# Patient Record
Sex: Female | Born: 1980 | Race: White | Hispanic: No | Marital: Married | Smoking: Current every day smoker
Health system: Southern US, Community
[De-identification: ages and names within clinical notes are randomized; demographics above are authoritative.]

## PROBLEM LIST (undated history)

## (undated) DIAGNOSIS — C801 Malignant (primary) neoplasm, unspecified: Secondary | ICD-10-CM

## (undated) DIAGNOSIS — I10 Essential (primary) hypertension: Secondary | ICD-10-CM

## (undated) DIAGNOSIS — E079 Disorder of thyroid, unspecified: Secondary | ICD-10-CM

## (undated) DIAGNOSIS — E119 Type 2 diabetes mellitus without complications: Secondary | ICD-10-CM

## (undated) HISTORY — PX: TUBAL LIGATION: SHX77

## (undated) HISTORY — PX: CHOLECYSTECTOMY: SHX55

---
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
## 2000-05-07 ENCOUNTER — Emergency Department (HOSPITAL_COMMUNITY): Admission: EM | Admit: 2000-05-07 | Discharge: 2000-05-07 | Payer: Self-pay | Admitting: Emergency Medicine

## 2000-05-10 ENCOUNTER — Emergency Department (HOSPITAL_COMMUNITY): Admission: EM | Admit: 2000-05-10 | Discharge: 2000-05-10 | Payer: Self-pay | Admitting: Emergency Medicine

## 2004-09-04 ENCOUNTER — Emergency Department: Payer: Self-pay | Admitting: Emergency Medicine

## 2004-11-17 ENCOUNTER — Ambulatory Visit (HOSPITAL_COMMUNITY): Admission: RE | Admit: 2004-11-17 | Discharge: 2004-11-17 | Payer: Self-pay | Admitting: Gynecology

## 2004-12-16 ENCOUNTER — Inpatient Hospital Stay (HOSPITAL_COMMUNITY): Admission: AD | Admit: 2004-12-16 | Discharge: 2004-12-16 | Payer: Self-pay | Admitting: Gynecology

## 2005-02-09 ENCOUNTER — Ambulatory Visit: Payer: Self-pay | Admitting: Gynecology

## 2005-02-15 ENCOUNTER — Emergency Department (HOSPITAL_COMMUNITY): Admission: EM | Admit: 2005-02-15 | Discharge: 2005-02-15 | Payer: Self-pay | Admitting: *Deleted

## 2005-02-19 ENCOUNTER — Ambulatory Visit: Payer: Self-pay | Admitting: Gynecology

## 2005-03-18 ENCOUNTER — Inpatient Hospital Stay (HOSPITAL_COMMUNITY): Admission: AD | Admit: 2005-03-18 | Discharge: 2005-03-20 | Payer: Self-pay | Admitting: Gynecology

## 2005-03-18 ENCOUNTER — Encounter (INDEPENDENT_AMBULATORY_CARE_PROVIDER_SITE_OTHER): Payer: Self-pay | Admitting: Specialist

## 2005-03-22 ENCOUNTER — Ambulatory Visit: Payer: Self-pay | Admitting: Gynecology

## 2005-10-01 ENCOUNTER — Ambulatory Visit (HOSPITAL_COMMUNITY): Admission: RE | Admit: 2005-10-01 | Discharge: 2005-10-02 | Payer: Self-pay | Admitting: General Surgery

## 2005-10-01 ENCOUNTER — Encounter (INDEPENDENT_AMBULATORY_CARE_PROVIDER_SITE_OTHER): Payer: Self-pay | Admitting: Specialist

## 2006-06-10 ENCOUNTER — Emergency Department: Payer: Self-pay | Admitting: Emergency Medicine

## 2009-11-02 ENCOUNTER — Emergency Department: Payer: Self-pay | Admitting: Emergency Medicine

## 2010-03-06 ENCOUNTER — Emergency Department (HOSPITAL_COMMUNITY)
Admission: EM | Admit: 2010-03-06 | Discharge: 2010-03-06 | Payer: Self-pay | Source: Home / Self Care | Admitting: Emergency Medicine

## 2010-08-07 NOTE — Discharge Summary (Signed)
Yvonne Olson, Yvonne Olson         ACCOUNT NO.:  [id number]   MEDICAL RECORD NO.:  [id number]          PATIENT TYPE:  INP   LOCATION:  9121                          FACILITY:  [facility]   PHYSICIAN:  Ginger Carne, MD  DATE OF BIRTH:  1980-09-04   DATE OF ADMISSION:  03/18/2005  DATE OF DISCHARGE:  03/20/2005                                 DISCHARGE SUMMARY   REASON FOR HOSPITALIZATION:  Thirty-seven and three-sevenths weeks  gestation, previous repeat cesarean section with possible uterine rupture  and severe lower abdominal pain.   IN-HOSPITAL PROCEDURES:  Repeat low transverse cesarean section and Pomeroy  bilateral tubal ligation.   FINAL DIAGNOSES:  1.  Unruptured uterus.  2.  Preterm viable delivery of female infant.  3.  Gestational diabetes.  4.  Sterilization.   HOSPITAL COURSE:  This patient is a 30 year old gravida 3 para 2-0-0-2  Caucasian female with [facility] April 04, 2005, at 63 and three-sevenths weeks  gestation presenting to the maternity admission unit with severe lower  abdominal pain. The fetal heart rate baseline was 140 without decelerations  and 5-25 beat-per-minute variability. The patient had a previous vaginal  birth after cesarean section in 1998 followed by a rupture, and in addition  in 1997 had a primary cesarean section. At time of the admission both her  husband and said patient were significantly concerned about impending or  possible rupture and based on the said symptoms, despite a normal fetal  heart rate tracing, the patient underwent a repeat low transverse cesarean  section and Pomeroy bilateral tubal ligation. It was difficult to explain to  the couple that more than likely the patient is not undergoing a rupture,  but patient emphasized her concerns. She has also been treated for  gestational diabetes, but non-compliant. Dates were based on last menstrual  period and 20-week ultrasound.   On December28,2006, the patient underwent the  aforementioned procedures. A  female infant weighing 9 pounds 1 ounce (4110 g) with a Apgars of 9 and 9  was delivered in a vertex presentation. No gross abnormalities, and the baby  cried spontaneously at delivery. There was no evidence of uterine rupture.   Postoperative course was uneventful. She was afebrile. Postoperative  hemoglobin 10.4, hematocrit 30.9. Her abdomen was soft. Incision was dry.  Calves without tenderness. The lungs were clear.   The patient was discharged with the routine postoperative and postpartum  instructions including contacting the office for temperature elevation above  100.4 degrees Fahrenheit, increasing abdominal or incisional pain,  incisional  drainage, increased vaginal bleeding, or genitourinary/gastrointestinal  symptoms. The patient was prescribed Percocet 5/325 one to two every 4-6  hours for analgesia and will return in 5 days for staple removal, incision  check, and then in 6 weeks for her regular postoperative/postpartum visit.  All instructions understood by said patient.      Ginger Carne, MD  Electronically Signed     SHB/MEDQ  D:  04/06/2005  T:  04/06/2005  Job:  [id number]

## 2010-08-07 NOTE — Discharge Summary (Signed)
NAMEHILMA, Yvonne Olson         ACCOUNT NO.:  [id number]   MEDICAL RECORD NO.:  [id number]          PATIENT TYPE:  INP   LOCATION:  9121                          [facility]ACILITY:  [facility]   PHYSICIAN:  Ginger Carne, MD  DATE OF BIRTH:  06/16/1980   DATE OF ADMISSION:  03/18/2005  DATE OF DISCHARGE:  03/20/2005                                 DISCHARGE SUMMARY   REASON FOR HOSPITALIZATION:  Thirty-seven and 2/[redacted] weeks gestation with  possible rupture of uterus, history of repeat cesarean section, requests for  sterilization, and gestational diabetes.   IN HOSPITAL PROCEDURES:  Repeat low-transverse cesarean section, Pomeroy  bilateral tubal ligation.   FINAL DIAGNOSES:  1.  Preterm viable delivery of female infant.  2.  Unruptured uterus.  3.  Repeat cesarean section.  4.  Sterilization.   HOSPITAL COURSE:  This is a 30 year old Caucasian female, [facility] April 04, 2005, 37 and 3/[redacted] weeks gestation, who was admitted because of severe lower  pelvic pain.  The patient had had a previous rupture of her uterus following  a failed VBAC.  The fetal heart rate tracing was within normal limits.  However, husband and patient were significantly concerned regarding the  possibility of rupture.  There was no evidence of vaginal bleeding, and the  baseline remained above 110 to 120 beats per minute.  After advising couple  about the possibility of premature issues related to delivery, a repeat low-  transverse cesarean section was performed including an intrapartum bilateral  Pomeroy tubal ligation.  No evidence for rupture was noted.  The patient's  postoperative course was uneventful.  She was afebrile.  Incisions dry.  Her  postoperative hemoglobin was 10.4 and hematocrit 30.9.  Incision was dry.  Calves without tenderness.  Lungs were clear.  The patient was discharged  with instructions, including contacting the office for temperature elevation  above 100.4 degrees Fahrenheit, increasing vaginal  bleeding, abdominal pain,  difficulty with urination, constipation, or any other concerns including  incisional drainage.  The patient was prescribed Percocet 5/325 one to two  every four to six hours and will be seen in the office in four to five days  to have her staples removed. Infant weighed 9 lbs. 1 oz. Apgars 9/9.      Ginger Carne, MD  Electronically Signed     SHB/MEDQ  D:  03/20/2005  T:  03/20/2005  Job:  [id number]

## 2010-08-07 NOTE — Op Note (Signed)
NAMEFERMINA, Yvonne Olson         ACCOUNT NO.:  [id number]   MEDICAL RECORD NO.:  [id number]          PATIENT TYPE:  OIB   LOCATION:  1329                         FACILITY:  [facility]   PHYSICIAN:  Leonie Man, M.D.   DATE OF BIRTH:  1980/07/25   DATE OF PROCEDURE:  10/01/2005  DATE OF DISCHARGE:                                 OPERATIVE REPORT   PREOPERATIVE DIAGNOSIS:  Chronic calculous cholecystitis.   POSTOPERATIVE DIAGNOSIS:  Chronic calculous cholecystitis.   PROCEDURE:  Laparoscopic cholecystectomy with interoperative cholangiogram.   SURGEON:  Leonie Man, M.D.   ASSISTANT:  Anselm Pancoast. Zachery Dakins, M.D.   ANESTHESIA:  General.   NOTE:  Ms. Lesko is a 30 year old female presenting with a six week  history of increasing right upper quadrant pain associated with nausea and  vomiting and exacerbated by fatty foods.  She underwent gallbladder  ultrasound which showed multiple stones wedged within the neck of the  gallbladder. Liver function studies are within normal limits.  She comes to  the operating room after the risks and potential benefits of surgery have  been discussed, all questions answered, and consent obtained.   PROCEDURE:  The patient was positioned supinely. Following the induction of  satisfactory general anesthesia, the abdomen is prepped and draped  routinely.  Open laparoscopy was created at the umbilicus with the insertion  of a Hassan cannula. Insufflation of the peritoneal cavity to 14 mmHg  pressure using carbon dioxide.  Camera inserted. Visual exploration of the  abdomen carried out.  The liver edge is sharp.  The liver surfaces smooth.  Anterior gastric wall and duodenal sweep appeared to be normal.  There were  some small adhesions within the pelvis from previous cesarean section and  tubal ligation.  The gallbladder was chronically scarred with multiple  adhesions of the omentum up to the gallbladder.   Under direct vision, epigastric and  flank ports were placed.  The  gallbladder is grasped and retracted cephalad.  Adhesions to the gallbladder  were taken down carrying the dissection down to the ampulla of the  gallbladder.  Within the ampulla, there was a large wedged stones.  Dissection was carried down to the region of the ampulla so as to isolate  the cystic artery and cystic duct.  The cystic duct was clipped proximally  after it was traced up to the cystic duct gallbladder junction. The cystic  duct was opened and the cystic duct cholangiogram was carried out by passing  a Cook catheter through the abdominal wall and into the cystic duct through  which 1/2 strength Hypaque was injected under fluoroscopic guidance.  The  resulting cholangiogram showed prompt flow of contrast into the duodenum and  the common duct, hepatic duct, and upper radical did not show any evidence  of filling defects.  The cholangiocatheter was removed and the cystic duct  was triply clipped and transected, the cystic artery doubly clamped and  transected.  The gallbladder was then dissected free from the liver bed  using electrocautery, maintaining hemostasis throughout the entire course of  the dissection.  At the end of the dissection, the liver bed  was again  thoroughly inspected for bleeding points and additional bleeding points  treated with electrocautery.  There was no evidence of bile leak.  The right  upper quadrant was then thoroughly irrigated with multiple aliquots of  normal saline.  The gallbladder was placed in an Endopouch and retrieved  through the umbilical wound without difficulty. Sponge, instrument and sharp  counts were verified and the trocars removed under direct vision.  The  wounds were then closed in layers as follows after the pneumoperitoneum was  allowed to deflate.  The umbilical wound in two layers using 0 Vicryl and 4-  0 Monocryl.  The epigastric and flank wounds were closed with 4-0 Monocryl  sutures.  All  wounds were reinforced with Steri-Strips and sterile dressings  applied.  The anesthetic was reversed.  The patient was removed from the  operating room to the recovery room in stable condition.  She tolerated this  procedure well.      Leonie Man, M.D.  Electronically Signed     PB/MEDQ  D:  10/01/2005  T:  10/01/2005  Job:  [id number]   cc:   Windle Guard, M.D.  Fax: [phone]

## 2010-08-07 NOTE — Op Note (Signed)
Yvonne Olson, Yvonne Olson         ACCOUNT NO.:  [id number]   MEDICAL RECORD NO.:  [id number]          PATIENT TYPE:  INP   LOCATION:  9198                          [facility]ACILITY[facility]  [facility]   PHYSICIAN:  Ginger Carne, MD  DATE OF BIRTH:  [birth year]   DATE OF PROCEDURE:  03/18/2005  DATE OF DISCHARGE:                                 OPERATIVE REPORT   PREOPERATIVE DIAGNOSES:  1.  Thirty-seven and two-sevenths weeks' gestation with possible rupture of      uterus.  2.  Repeat cesarean section.  3.  Request for sterilization.  4.  History of gestational diabetes.   POSTOPERATIVE DIAGNOSES:  1.  Preterm viable delivery of a female.  2.  Unruptured uterus.  3.  Repeat cesarean section.  4.  Request for permanent sterilization.  5.  Gestational diabetes.   PROCEDURES:  1.  Repeat low-transverse cesarean section.  2.  Pomeroy bilateral tubal ligation.   SURGEON:  Ginger Carne, M.D.   ASSISTANT:  None.   COMPLICATIONS:  None immediate.   ESTIMATED BLOOD LOSS:  600 mL.   ANESTHESIA:  Spinal.   SPECIMEN:  Portions of right and left tube and cord bloods.   OPERATIVE FINDINGS:  A preterm female delivered with Apgars of 9 and 9,  weight 9 pounds 1 ounce, at 1052 hours on March 18, 2005.  No gross  abnormalities.  The baby cried spontaneously at delivery, vertex  presentation.  Amniotic fluid was clear.  Three-vessel cord, central  insertion.  Placenta complete.  The uterus, tubes and ovaries showed normal  decidual changes of pregnancy.   PROCEDURE:  The patient prepped and draped in the usual fashion and placed  in the left lateral supine position, Betadine solution used for antiseptic,  and the patient was catheterized prior to the procedure.  After adequate  spinal analgesia, a Pfannenstiel incision was made and the abdomen opened.  No evidence of rupture or dehiscence was noted.  The bladder flap incised  transversely, the lower uterine segment incised transversely.   Baby  delivered, cord clamped and cut and infant given to the pediatric staff  after bulb suctioning.  The placenta removed manually, uterus inspected.  Closure of the uterine musculature in one layer using Vicryl running suture,  interlocking.   Pomeroy bilateral tubal ligation performed by grasping either tube at the  isthmus-ampullary junction, 2-3 cm of tube were incorporated into separate 2-  0 plain catgut ties.  Tubes then severed above the knots.  Specimens sent  separately to pathology.  Tips of severed tubes cauterized.  No active  bleeding noted.  Blood clots removed.  Bleeding points hemostatically  checked.  Closure of the parietal peritoneum 2-0 Vicryl running suture, 0  Vicryl running for the fascia from either end to midline, 3-0 Vicryl for the  subcutaneous layer and skin staples for the skin.  Instrument and sponge  count were correct.  The patient tolerated the procedure well and returned  to the post anesthesia recovery room in excellent condition.      Ginger Carne, MD  Electronically Signed     SHB/MEDQ  D:  03/18/2005  T:  03/18/2005  Job:  [id number]

## 2010-09-07 ENCOUNTER — Inpatient Hospital Stay (INDEPENDENT_AMBULATORY_CARE_PROVIDER_SITE_OTHER)
Admission: RE | Admit: 2010-09-07 | Discharge: 2010-09-07 | Disposition: A | Discharge status: Home / Self Care | Payer: Self-pay | Source: Ambulatory Visit | Attending: Emergency Medicine | Admitting: Emergency Medicine

## 2010-09-07 DIAGNOSIS — T6391XA Toxic effect of contact with unspecified venomous animal, accidental (unintentional), initial encounter: Secondary | ICD-10-CM

## 2010-11-27 ENCOUNTER — Emergency Department: Payer: Self-pay | Admitting: *Deleted

## 2011-08-11 ENCOUNTER — Emergency Department: Payer: Self-pay | Admitting: *Deleted

## 2011-08-11 LAB — BASIC METABOLIC PANEL
BUN: 16 mg/dL (ref 7–18)
Calcium, Total: 8.7 mg/dL (ref 8.5–10.1)
Chloride: 107 mmol/L (ref 98–107)
Co2: 25 mmol/L (ref 21–32)
EGFR (African American): >60
Glucose: 166 mg/dL — ABNORMAL HIGH (ref 65–99)
Osmolality: 284 (ref 275–301)
Potassium: 3.7 mmol/L (ref 3.5–5.1)
Sodium: 140 mmol/L (ref 136–145)

## 2011-08-11 LAB — CBC
HCT: 37.6 % (ref 35.0–47.0)
MCH: 30.9 pg (ref 26.0–34.0)
MCHC: 34.7 g/dL (ref 32.0–36.0)
MCV: 89 fL (ref 80–100)
RDW: 13.4 % (ref 11.5–14.5)

## 2011-08-11 LAB — TSH: Thyroid Stimulating Horm: 2.81 u[IU]/mL

## 2012-06-19 DIAGNOSIS — R87619 Unspecified abnormal cytological findings in specimens from cervix uteri: Secondary | ICD-10-CM

## 2012-06-19 DIAGNOSIS — K2970 Gastritis, unspecified, without bleeding: Secondary | ICD-10-CM

## 2012-06-19 DIAGNOSIS — R197 Diarrhea, unspecified: Secondary | ICD-10-CM

## 2012-06-19 DIAGNOSIS — Z720 Tobacco use: Secondary | ICD-10-CM | POA: Insufficient documentation

## 2012-06-19 DIAGNOSIS — E119 Type 2 diabetes mellitus without complications: Secondary | ICD-10-CM

## 2012-06-19 HISTORY — DX: Tobacco use: Z72.0

## 2012-06-19 HISTORY — DX: Unspecified abnormal cytological findings in specimens from cervix uteri: R87.619

## 2012-06-19 HISTORY — DX: Type 2 diabetes mellitus without complications: E11.9

## 2012-06-19 HISTORY — DX: Gastritis, unspecified, without bleeding: K29.70

## 2012-06-19 HISTORY — DX: Diarrhea, unspecified: R19.7

## 2012-09-03 IMAGING — US THYROID ULTRASOUND
1 series · 14 of 23 positions shown · non-contrast
Comparison: none

REASON FOR EXAM: right inferior nuchal mass
COMMENTS:   LMP: Now

PROCEDURE:     US  - US SOFT TISSUE HEAD/NECK  - August 11, 2011 [DATE]
RESULT:

[Series 1: thyroid ultrasound · 0.09mm/px · 14 of 23 slices shown]
[im 1/23]
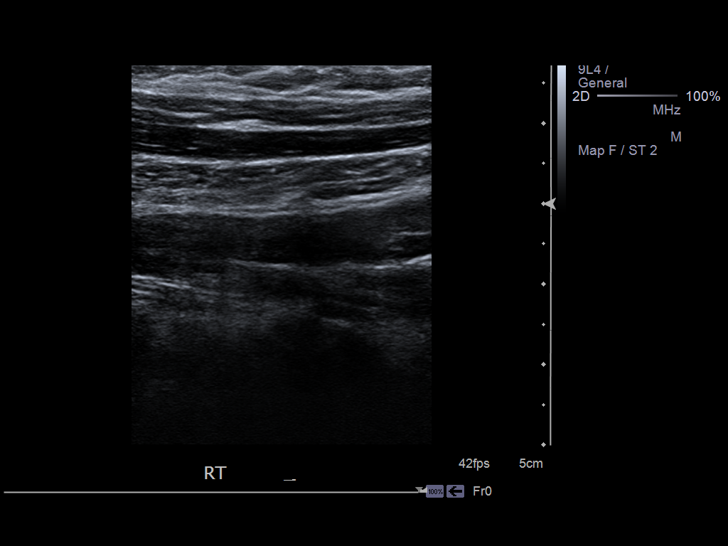
[im 3/23]
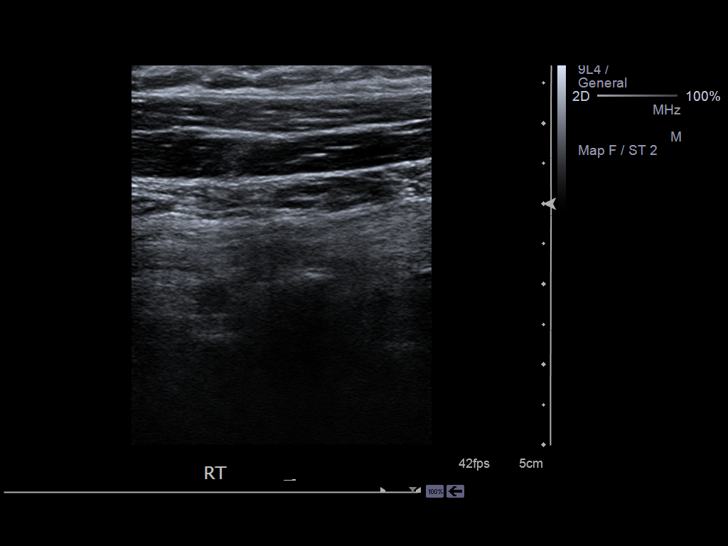
[im 5/23]
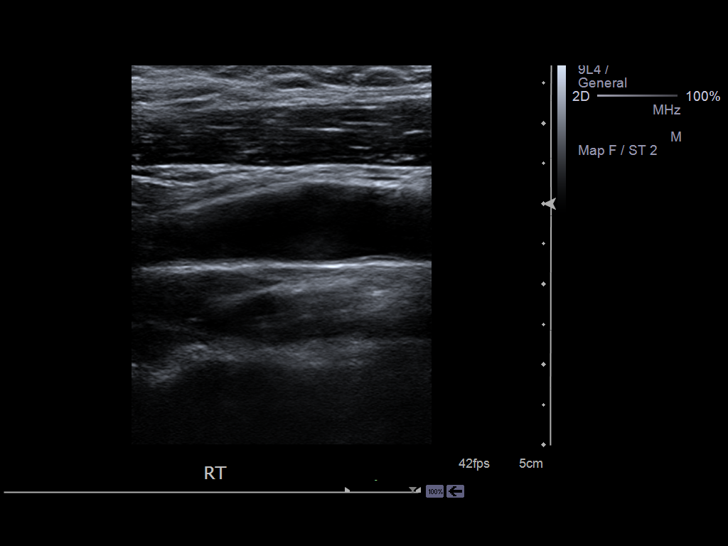
[im 6/23]
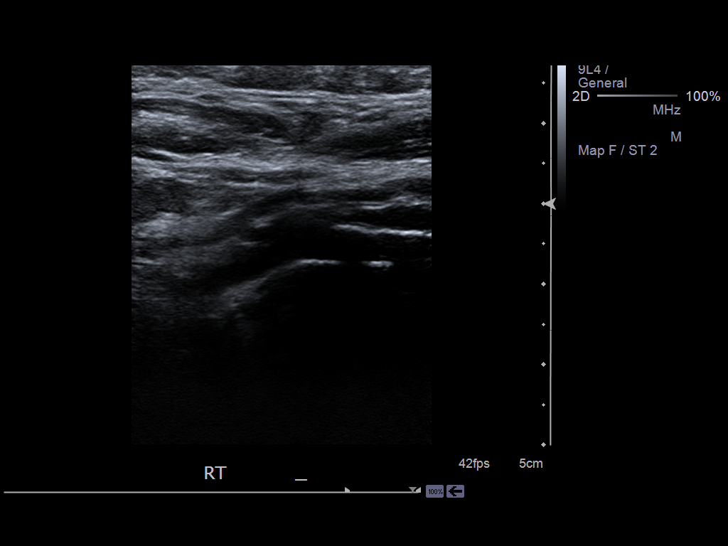
[im 8/23]
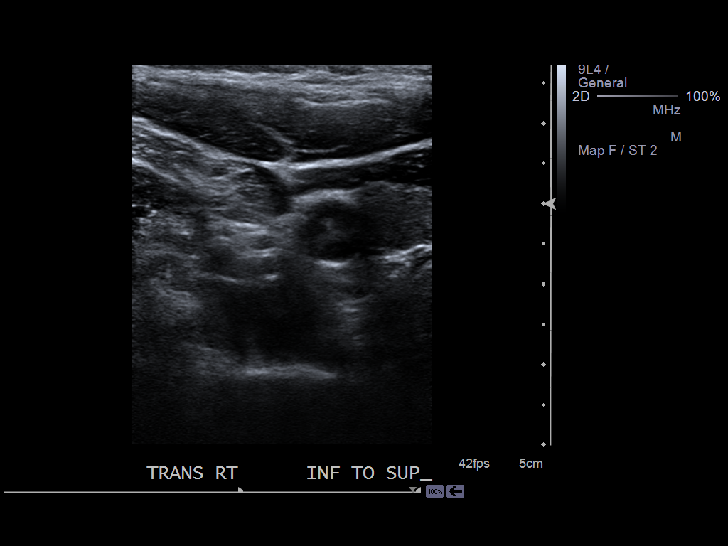
[im 10/23]
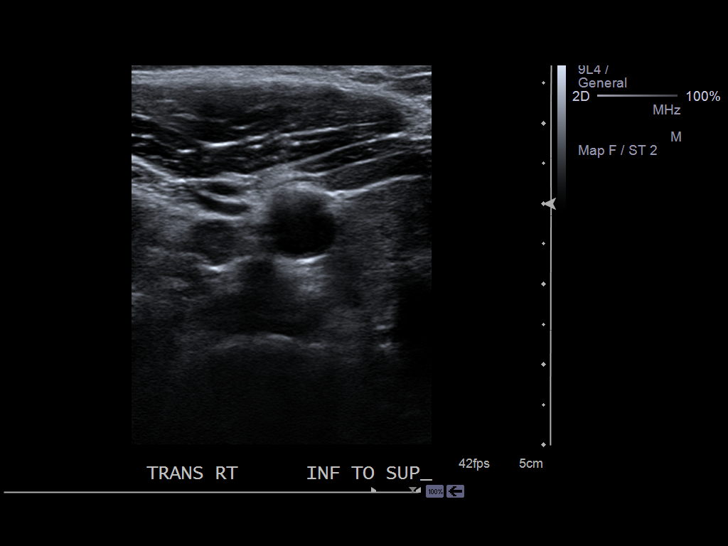
[im 11/23]
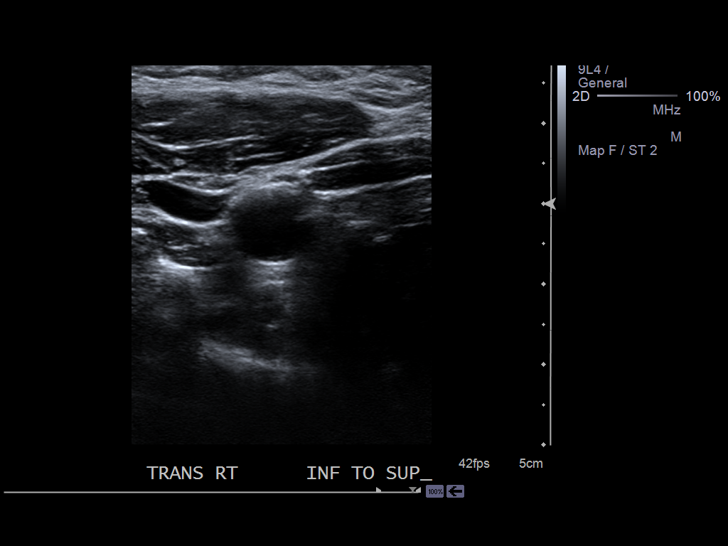
[im 13/23]
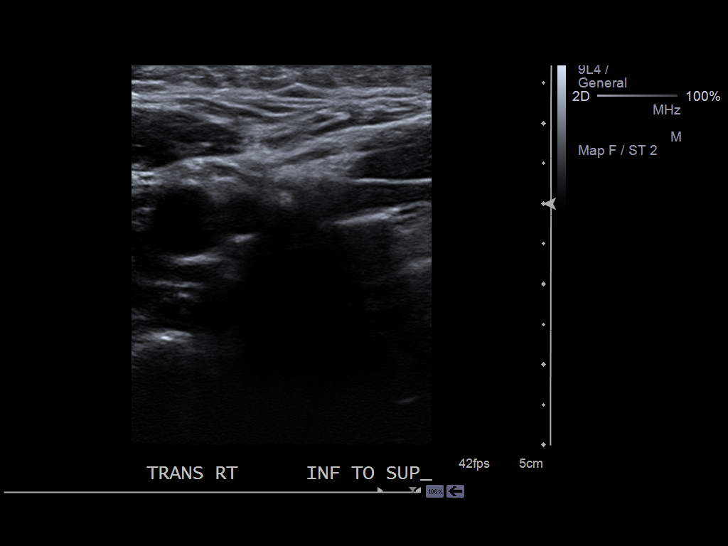
[im 14/23]
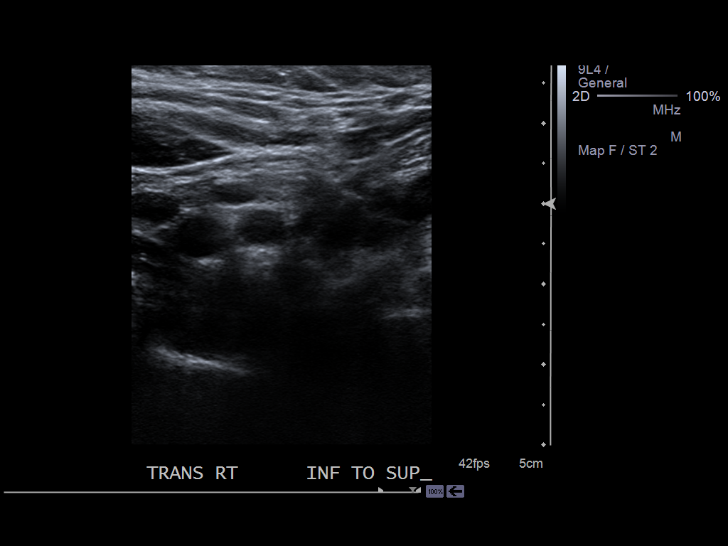
[im 16/23]
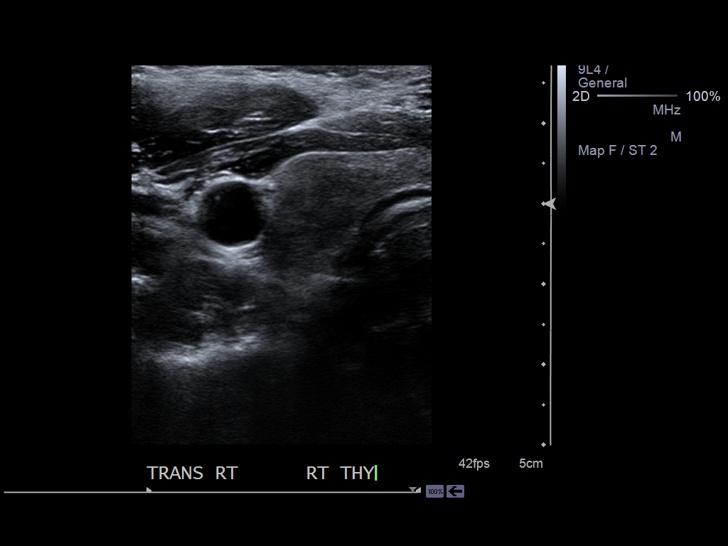
[im 18/23]
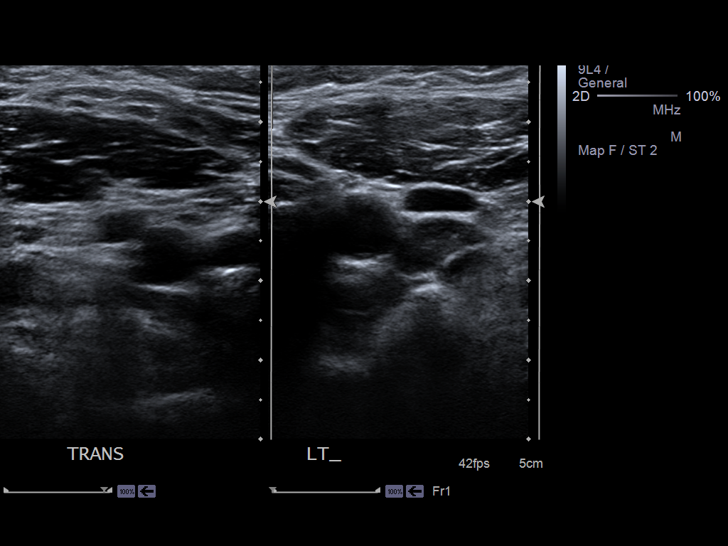
[im 19/23]
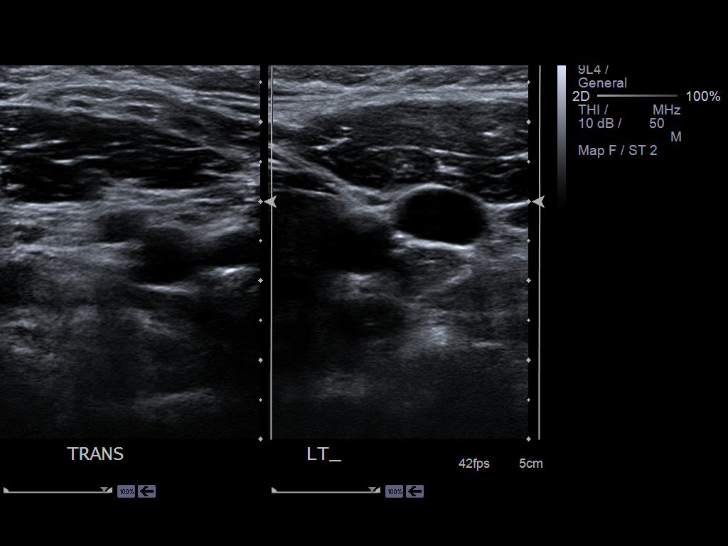
[im 21/23]
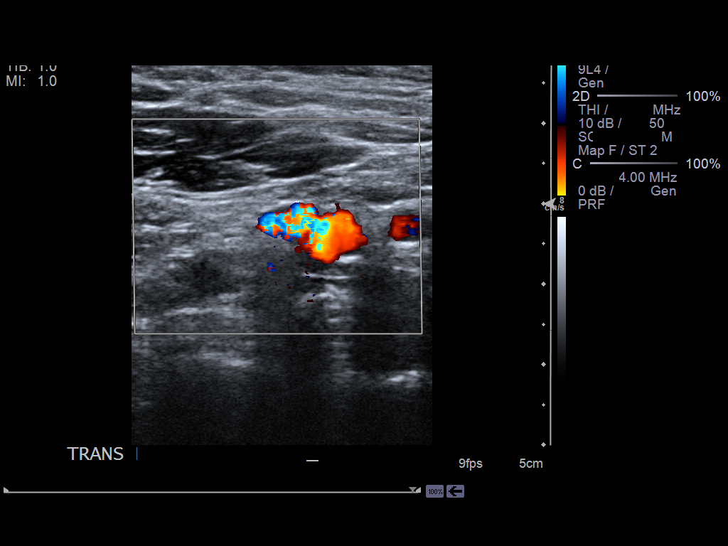
[im 23/23]
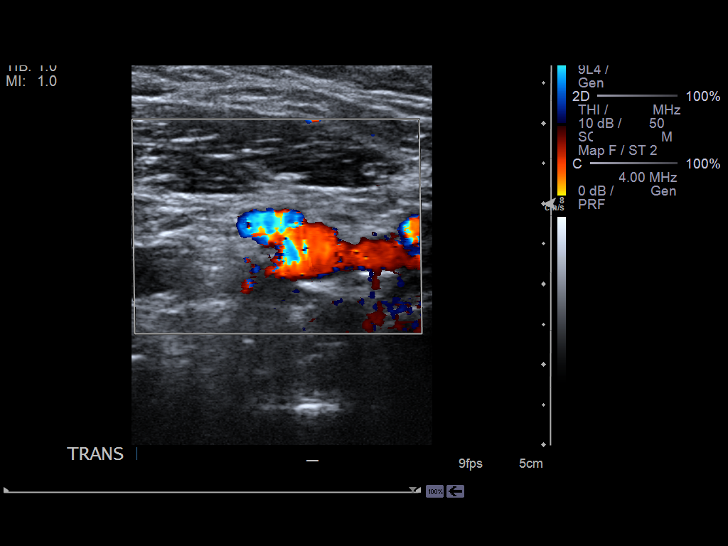

[14 of 23 positions shown; findings below may reference images not displayed]

FINDINGS: Evaluation of the soft tissues of the neck in the region of
interest demonstrates no evidence of solid or cystic masses. The visualized
vasculature is unremarkable. The vessels are patent. Color filling is
identified within the visualized venous and arterial vessels. The visualized
portions of the thyroid gland on the right are unremarkable.
IMPRESSION: 1. Unremarkable sonographic evaluation of the region of interest in the neck
on the right.
2. Charal[place]ia Saparilla, Physician's Assistant of the Emergency Department was
informed of these findings via a preliminary faxed report dated 08/11/2011 at
[DATE] p.m. Eastern Standard Time.

## 2012-12-05 DIAGNOSIS — F419 Anxiety disorder, unspecified: Secondary | ICD-10-CM | POA: Insufficient documentation

## 2012-12-05 HISTORY — DX: Anxiety disorder, unspecified: F41.9

## 2013-03-08 DIAGNOSIS — M26629 Arthralgia of temporomandibular joint, unspecified side: Secondary | ICD-10-CM | POA: Insufficient documentation

## 2013-03-08 DIAGNOSIS — K219 Gastro-esophageal reflux disease without esophagitis: Secondary | ICD-10-CM | POA: Insufficient documentation

## 2013-03-08 HISTORY — DX: Arthralgia of temporomandibular joint, unspecified side: M26.629

## 2013-08-25 ENCOUNTER — Emergency Department: Payer: Self-pay | Admitting: Emergency Medicine

## 2013-08-25 LAB — COMPREHENSIVE METABOLIC PANEL
ALT: 43 U/L (ref 12–78)
Albumin: 4 g/dL (ref 3.4–5.0)
Alkaline Phosphatase: 84 U/L
Anion Gap: 10 (ref 7–16)
BILIRUBIN TOTAL: 0.3 mg/dL (ref 0.2–1.0)
BUN: 11 mg/dL (ref 7–18)
CALCIUM: 8.5 mg/dL (ref 8.5–10.1)
Chloride: 108 mmol/L — ABNORMAL HIGH (ref 98–107)
Co2: 21 mmol/L (ref 21–32)
Creatinine: 0.57 mg/dL — ABNORMAL LOW (ref 0.60–1.30)
EGFR (African American): >60
EGFR (Non-African Amer.): >60
Glucose: 93 mg/dL (ref 65–99)
Osmolality: 277 (ref 275–301)
POTASSIUM: 3.3 mmol/L — AB (ref 3.5–5.1)
SGOT(AST): 26 U/L (ref 15–37)
Sodium: 139 mmol/L (ref 136–145)
TOTAL PROTEIN: 7.1 g/dL (ref 6.4–8.2)

## 2013-08-25 LAB — DRUG SCREEN, URINE

## 2013-08-25 LAB — URINALYSIS, COMPLETE
Bilirubin,UR: NEGATIVE
Blood: NEGATIVE
Glucose,UR: NEGATIVE mg/dL (ref 0–75)
Ketone: NEGATIVE
Leukocyte Esterase: NEGATIVE
Nitrite: NEGATIVE
Ph: 6 (ref 4.5–8.0)
Protein: NEGATIVE
RBC,UR: <1 /HPF (ref 0–5)
Specific Gravity: 1.005 (ref 1.003–1.030)
Squamous Epithelial: 2
WBC UR: <1 /HPF (ref 0–5)

## 2013-08-25 LAB — TSH: Thyroid Stimulating Horm: 0.918 u[IU]/mL

## 2013-08-25 LAB — CBC WITH DIFFERENTIAL/PLATELET
BASOS ABS: 0 10*3/uL (ref 0.0–0.1)
BASOS PCT: 0.6 %
Eosinophil #: 0 10*3/uL (ref 0.0–0.7)
Eosinophil %: 0.7 %
HCT: 39.8 % (ref 35.0–47.0)
HGB: 13.1 g/dL (ref 12.0–16.0)
LYMPHS PCT: 38.6 %
Lymphocyte #: 2.8 10*3/uL (ref 1.0–3.6)
MCH: 29.6 pg (ref 26.0–34.0)
MCHC: 32.9 g/dL (ref 32.0–36.0)
MCV: 90 fL (ref 80–100)
MONOS PCT: 7.3 %
Monocyte #: 0.5 x10 3/mm (ref 0.2–0.9)
NEUTROS ABS: 3.8 10*3/uL (ref 1.4–6.5)
NEUTROS PCT: 52.8 %
Platelet: 163 10*3/uL (ref 150–440)
RBC: 4.43 10*6/uL (ref 3.80–5.20)
RDW: 14.6 % — ABNORMAL HIGH (ref 11.5–14.5)
WBC: 7.3 10*3/uL (ref 3.6–11.0)

## 2013-08-25 LAB — SALICYLATE LEVEL: Salicylates, Serum: 5.9 mg/dL — ABNORMAL HIGH

## 2013-08-25 LAB — ACETAMINOPHEN LEVEL: Acetaminophen: <2 ug/mL

## 2013-08-25 LAB — ETHANOL
Ethanol %: 0.169 % — ABNORMAL HIGH (ref 0.000–0.080)
Ethanol: 169 mg/dL

## 2013-08-25 LAB — TROPONIN I: Troponin-I: <0.02 ng/mL

## 2013-08-26 LAB — ACETAMINOPHEN LEVEL: Acetaminophen: <2 ug/mL

## 2013-10-20 DIAGNOSIS — E119 Type 2 diabetes mellitus without complications: Secondary | ICD-10-CM | POA: Insufficient documentation

## 2013-10-20 DIAGNOSIS — F17200 Nicotine dependence, unspecified, uncomplicated: Secondary | ICD-10-CM | POA: Insufficient documentation

## 2013-10-20 DIAGNOSIS — Z23 Encounter for immunization: Secondary | ICD-10-CM | POA: Insufficient documentation

## 2013-10-20 DIAGNOSIS — S61209A Unspecified open wound of unspecified finger without damage to nail, initial encounter: Secondary | ICD-10-CM | POA: Insufficient documentation

## 2013-10-20 DIAGNOSIS — Z859 Personal history of malignant neoplasm, unspecified: Secondary | ICD-10-CM | POA: Insufficient documentation

## 2013-10-21 ENCOUNTER — Encounter (HOSPITAL_COMMUNITY): Payer: Self-pay | Admitting: Emergency Medicine

## 2013-10-21 ENCOUNTER — Emergency Department (HOSPITAL_COMMUNITY)
Admission: EM | Admit: 2013-10-21 | Discharge: 2013-10-21 | Disposition: A | Discharge status: Home / Self Care | Payer: Self-pay | Attending: Emergency Medicine | Admitting: Emergency Medicine

## 2013-10-21 DIAGNOSIS — S61211A Laceration without foreign body of left index finger without damage to nail, initial encounter: Secondary | ICD-10-CM

## 2013-10-21 HISTORY — DX: Malignant (primary) neoplasm, unspecified: C80.1

## 2013-10-21 HISTORY — DX: Type 2 diabetes mellitus without complications: E11.9

## 2013-10-21 MED ORDER — TETANUS-DIPHTH-ACELL PERTUSSIS 5-2.5-18.5 LF-MCG/0.5 IM SUSP
0.5000 mL | Freq: Once | INTRAMUSCULAR | Status: AC
  Administered 2013-10-21: 0.5 mL via INTRAMUSCULAR
  Filled 2013-10-21: qty 0.5
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
## 2013-10-21 NOTE — ED Provider Notes (Signed)
Medical screening examination/treatment/procedure(s) were performed by non-physician practitioner and as supervising physician I was immediately available for consultation/collaboration.   EKG Interpretation None       Kalman Drape, MD 10/21/13 [phone]
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
## 2013-10-21 NOTE — Discharge Instructions (Signed)
Keep cut clean. Apply bacitracin ointment twice a day. Ibuprofen or tylenol for pain. Follow up in 7 days for suture removal.   Laceration Care, Adult A laceration is a cut or lesion that goes through all layers of the skin and into the tissue just beneath the skin. TREATMENT  Some lacerations may not require closure. Some lacerations may not be able to be closed due to an increased risk of infection. It is important to see your caregiver as soon as possible after an injury to minimize the risk of infection and maximize the opportunity for successful closure. If closure is appropriate, pain medicines may be given, if needed. The wound will be cleaned to help prevent infection. Your caregiver will use stitches (sutures), staples, wound glue (adhesive), or skin adhesive strips to repair the laceration. These tools bring the skin edges together to allow for faster healing and a better cosmetic outcome. However, all wounds will heal with a scar. Once the wound has healed, scarring can be minimized by covering the wound with sunscreen during the day for 1 full year. HOME CARE INSTRUCTIONS  For sutures or staples:  Keep the wound clean and dry.  If you were given a bandage (dressing), you should change it at least once a day. Also, change the dressing if it becomes wet or dirty, or as directed by your caregiver.  Wash the wound with soap and water 2 times a day. Rinse the wound off with water to remove all soap. Pat the wound dry with a clean towel.  After cleaning, apply a thin layer of the antibiotic ointment as recommended by your caregiver. This will help prevent infection and keep the dressing from sticking.  You may shower as usual after the first 24 hours. Do not soak the wound in water until the sutures are removed.  Only take over-the-counter or prescription medicines for pain, discomfort, or fever as directed by your caregiver.  Get your sutures or staples removed as directed by your  caregiver. For skin adhesive strips:  Keep the wound clean and dry.  Do not get the skin adhesive strips wet. You may bathe carefully, using caution to keep the wound dry.  If the wound gets wet, pat it dry with a clean towel.  Skin adhesive strips will fall off on their own. You may trim the strips as the wound heals. Do not remove skin adhesive strips that are still stuck to the wound. They will fall off in time. For wound adhesive:  You may briefly wet your wound in the shower or bath. Do not soak or scrub the wound. Do not swim. Avoid periods of heavy perspiration until the skin adhesive has fallen off on its own. After showering or bathing, gently pat the wound dry with a clean towel.  Do not apply liquid medicine, cream medicine, or ointment medicine to your wound while the skin adhesive is in place. This may loosen the film before your wound is healed.  If a dressing is placed over the wound, be careful not to apply tape directly over the skin adhesive. This may cause the adhesive to be pulled off before the wound is healed.  Avoid prolonged exposure to sunlight or tanning lamps while the skin adhesive is in place. Exposure to ultraviolet light in the first year will darken the scar.  The skin adhesive will usually remain in place for 5 to 10 days, then naturally fall off the skin. Do not pick at the adhesive film. You  may need a tetanus shot if:  You cannot remember when you had your last tetanus shot.  You have never had a tetanus shot. If you get a tetanus shot, your arm may swell, get red, and feel warm to the touch. This is common and not a problem. If you need a tetanus shot and you choose not to have one, there is a rare chance of getting tetanus. Sickness from tetanus can be serious. SEEK MEDICAL CARE IF:   You have redness, swelling, or increasing pain in the wound.  You see a red line that goes away from the wound.  You have yellowish-white fluid (pus) coming from  the wound.  You have a fever.  You notice a bad smell coming from the wound or dressing.  Your wound breaks open before or after sutures have been removed.  You notice something coming out of the wound such as wood or glass.  Your wound is on your hand or foot and you cannot move a finger or toe. SEEK IMMEDIATE MEDICAL CARE IF:   Your pain is not controlled with prescribed medicine.  You have severe swelling around the wound causing pain and numbness or a change in color in your arm, hand, leg, or foot.  Your wound splits open and starts bleeding.  You have worsening numbness, weakness, or loss of function of any joint around or beyond the wound.  You develop painful lumps near the wound or on the skin anywhere on your body. MAKE SURE YOU:   Understand these instructions.  Will watch your condition.  Will get help right away if you are not doing well or get worse. Document Released: 03/08/2005 Document Revised: 05/31/2011 Document Reviewed: 09/01/2010 [facility] Patient Information 2015 [place], [place]. This information is not intended to replace advice given to you by your health care provider. Make sure you discuss any questions you have with your health care provider.
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
## 2014-12-28 ENCOUNTER — Emergency Department (HOSPITAL_COMMUNITY)
Admission: EM | Admit: 2014-12-28 | Discharge: 2014-12-29 | Disposition: A | Discharge status: Home / Self Care | Payer: Medicaid Other | Attending: Emergency Medicine | Admitting: Emergency Medicine

## 2014-12-28 ENCOUNTER — Encounter (HOSPITAL_COMMUNITY): Payer: Self-pay | Admitting: Emergency Medicine

## 2014-12-28 ENCOUNTER — Emergency Department (HOSPITAL_COMMUNITY): Payer: Medicaid Other

## 2014-12-28 DIAGNOSIS — S81802A Unspecified open wound, left lower leg, initial encounter: Secondary | ICD-10-CM | POA: Insufficient documentation

## 2014-12-28 DIAGNOSIS — Y998 Other external cause status: Secondary | ICD-10-CM | POA: Diagnosis not present

## 2014-12-28 DIAGNOSIS — Z720 Tobacco use: Secondary | ICD-10-CM | POA: Insufficient documentation

## 2014-12-28 DIAGNOSIS — Y9301 Activity, walking, marching and hiking: Secondary | ICD-10-CM | POA: Diagnosis not present

## 2014-12-28 DIAGNOSIS — E119 Type 2 diabetes mellitus without complications: Secondary | ICD-10-CM | POA: Insufficient documentation

## 2014-12-28 DIAGNOSIS — Z859 Personal history of malignant neoplasm, unspecified: Secondary | ICD-10-CM | POA: Insufficient documentation

## 2014-12-28 DIAGNOSIS — Y92000 Kitchen of unspecified non-institutional (private) residence as  the place of occurrence of the external cause: Secondary | ICD-10-CM | POA: Insufficient documentation

## 2014-12-28 DIAGNOSIS — X58XXXA Exposure to other specified factors, initial encounter: Secondary | ICD-10-CM | POA: Diagnosis not present

## 2014-12-28 DIAGNOSIS — S99922A Unspecified injury of left foot, initial encounter: Secondary | ICD-10-CM | POA: Diagnosis present

## 2014-12-28 DIAGNOSIS — S86012A Strain of left Achilles tendon, initial encounter: Secondary | ICD-10-CM

## 2014-12-28 MED ORDER — OXYCODONE-ACETAMINOPHEN 5-325 MG PO TABS
2.0000 | ORAL_TABLET | Freq: Once | ORAL | Status: AC
  Administered 2014-12-28: 2 via ORAL
  Filled 2014-12-28: qty 2

## 2014-12-28 MED ORDER — HYDROCODONE-ACETAMINOPHEN 5-325 MG PO TABS: 1.0000 | ORAL_TABLET | Freq: Four times a day (QID) | ORAL | Status: DC | PRN

## 2014-12-28 NOTE — ED Provider Notes (Signed)
CSN: [id number]     Arrival date & time 12/28/14  2158 History  By signing my name below, I, Evelene Croon, attest that this documentation has been prepared under the direction and in the presence of non-physician practitioner, Abigail Butts, PA-C. Electronically Signed: Evelene Croon, Scribe. 12/28/2014. 11:38 PM.    Chief Complaint  Patient presents with  . Foot Pain   The history is provided by the patient. No language interpreter was used.   HPI Comments:  Yvonne Olson is a 34 y.o. female who presents to the Emergency Department complaining of left foot pain that started after she heard a popping sound this evening ~1 hour PTA. She was walking around her kitchen at the time of symptom onset.Her pain is exacerbated with movement of her toes and ankle. She reports a history of torn achilles tendon ~2-3 years ago to the left foot; notes her pain today is similar. She states she did not require surgery at the time.  No alleviating factors or associated symptoms noted.  Past Medical History  Diagnosis Date  . Diabetes mellitus without complication ([place])   . Cancer [facility])    History reviewed. No pertinent past surgical history. No family history on file. Social History  Substance Use Topics  . Smoking status: Current Every Day Smoker  . Smokeless tobacco: Never Used  . Alcohol Use: Yes   OB History    Gravida Para Term Preterm AB TAB SAB Ectopic Multiple Living   3 3 3       3      Review of Systems  Constitutional: Negative for fever and chills.  Gastrointestinal: Negative for nausea and vomiting.  Musculoskeletal: Positive for myalgias, joint swelling and arthralgias. Negative for back pain, neck pain and neck stiffness.  Skin: Negative for wound.  Neurological: Negative for numbness.  Hematological: Does not bruise/bleed easily.  Psychiatric/Behavioral: The patient is not nervous/anxious.   All other systems reviewed and are negative.  Allergies  Review of  patient's allergies indicates no known allergies.  Home Medications   Prior to Admission medications   Medication Sig Start Date End Date Taking? Authorizing Provider  HYDROcodone-acetaminophen (NORCO/VICODIN) 5-325 MG tablet Take 1 tablet by mouth every 6 (six) hours as needed for moderate pain or severe pain. 12/28/14   Kearra Calkin, PA-C   BP 132/67 mmHg  Pulse 92  Temp(Src) 98.4 F (36.9 C) (Oral)  Resp 20  Ht 5\' 11"  (1.803 m)  Wt 205 lb (92.987 kg)  BMI 28.60 kg/m2  SpO2 100%  LMP 12/18/2014 (Approximate) Physical Exam  Constitutional: She appears well-developed and well-nourished. No distress.  HENT:  Head: Normocephalic and atraumatic.  Eyes: Conjunctivae are normal.  Neck: Normal range of motion.  Cardiovascular: Normal rate, regular rhythm, normal heart sounds and intact distal pulses.   No murmur heard. Capillary refill < 3 sec  Pulmonary/Chest: Effort normal and breath sounds normal.  Musculoskeletal: She exhibits tenderness. She exhibits no edema.  ROM: Full range of motion of the left hip, knee Almost full active range of motion of the left ankle in all toes of left foot Significant tenderness to palpation of the distal Achilles tendon No palpable defect Negative Thompson's test  Neurological: She is alert. Coordination normal.  Sensation intact to dull and sharp in the left lower extremity Strength 4/5 with dorsiflexion plantar flexion due to pain  Skin: Skin is warm and dry. She is not diaphoretic.  No tenting of the skin  Psychiatric: She has a normal mood  and affect.  Nursing note and vitals reviewed.   ED Course  Procedures   DIAGNOSTIC STUDIES:  Oxygen Saturation is 100% on RA, normal by my interpretation.    COORDINATION OF CARE:  10:57 PM Discussed treatment plan with pt at bedside and pt agreed to plan.  11:38 PM EMERGENCY DEPARTMENT US SOFT TISSUE INTERPRETATION "Study: Limited Ultrasound of the noted body part in comments  below"  INDICATIONS: Pain Multiple views of the body part are obtained with a multi-frequency linear probe  PERFORMED BY:  Myself  IMAGES ARCHIVED?: Yes  SIDE:Left  BODY PART: Left achilles tendon  FINDINGS: Other decreased echogenicity in the distal achilles  LIMITATIONS:  Body Habitus  INTERPRETATION:  Decreased echogenicity in the achilles likely representing tears in the achilles    Labs Review Labs Reviewed - No data to display  Imaging Review Dg Foot Complete Left  12/28/2014   CLINICAL DATA:  Twisting injury with pain in the posterior left ankle.  EXAM: LEFT FOOT - COMPLETE 3+ VIEW  COMPARISON:  None.  FINDINGS: Negative for acute fracture or dislocation. There is a large calcified spur at the Achilles insertion and a much smaller plantar calcaneal spur. No bone lesion or bony destruction. No acute soft tissue abnormality. Small fragments at the base of the fifth metatarsal likely are due to remote trauma.  IMPRESSION: Negative for acute fracture   Electronically Signed   By: Andreas Newport M.D.   On: 12/28/2014 23:03   I have personally reviewed and evaluated these images as part of my medical decision-making.   EKG Interpretation None      MDM   Final diagnoses:  Achilles tendon tear, left, initial encounter    [place] presents with left posterior ankle pain.  SHe has a hx of Achilles tendon rupture which was managed medically. She reports pain is same.  Plain films of the ankle are without acute abnormality. Ultrasound shows likely small tears in the Achilles tendon.  Patient will be placed in a cam walker and be made nonweightbearing until she can follow-up with orthopedics.  Pain controlled here in the emergency department.  BP 132/67 mmHg  Pulse 92  Temp(Src) 98.4 F (36.9 C) (Oral)  Resp 20  Ht 5\' 11"  (1.803 m)  Wt 205 lb (92.987 kg)  BMI 28.60 kg/m2  SpO2 100%  LMP 12/18/2014 (Approximate)  I personally performed the services  described in this documentation, which was scribed in my presence. The recorded information has been reviewed and is accurate.   Jarrett Soho Anastasio Wogan, PA-C 12/29/14 [place], MD 12/29/14 [phone]

## 2014-12-28 NOTE — ED Notes (Signed)
Pt states that she was at the sink and turned from the sink and felt something "pop" in her left ankle. Pt states has hx of tearing left achilles tendon approx. 21/2 years ago. Pt. Unable to bear weight on foot. Pt A/O x4 and NAD noted.

## 2014-12-28 NOTE — Discharge Instructions (Signed)
1. Medications: Vicodin, usual home medications 2. Treatment: rest, drink plenty of fluids, rest ice, wear CAM walker 3. Follow Up: Please followup with orthopedics in 3 days (either the referral physician or your own orthopedist) for discussion of your diagnoses and further evaluation after today's visit; if you do not have a primary care doctor use the resource guide provided to find one; Please return to the ER for worsening symptoms    Partial (Incomplete) Achilles Tendon Rupture An Achilles tendon rupture is an injury in which the tough, cord-like band that attaches the lower muscles of your leg to your heel (Achilles tendon) tears (ruptures). In a partial Achilles tendon rupture, surgery may not be needed. CAUSES  A tendon may rupture if it is weakened or weakening (degenerative) and a sudden stress is applied to it. Weakening or degeneration of a tendon may be caused by:  Recurrent injuries, such as those causing Achilles tendonitis.  Damaged tendons.  Aging.  Vascular disease of the tendon. SIGNS AND SYMPTOMS  Feeling as if you were struck violently in the back of the ankle.  Hearing a "pop" and experiencing severe, sudden (acute) pain; however, the absence of pain does not mean there was not a rupture. DIAGNOSIS A physical exam is usually all that is needed to diagnose an Achilles tendon rupture. During the exam, your health care provider will touch the tendon and the structures around it. You may be asked to lie on your stomach or kneel on a chair while your health care provider squeezes your calf muscle. You most likely have a ruptured tendon if your foot does not flex.  Sometimes tests are performed. These may include:  Ultrasonography. This allows quick confirmation of the diagnosis.  An X-ray exam.  An MRI. TREATMENT  Treatment consists of:  Ice applied to the area.  Pain relieving medicines.  Rest.  Crutches.  Keeping the ankle from moving (immobilization),  usually with asplint, for 6-10 weeks. If the injury does not improve within 3-6 months, you may need to go to a specialized runners' clinic or see a sports medicine specialist, physical therapist, or orthopedic surgeon. HOME CARE INSTRUCTIONS   Apply ice to the injured area:   Put ice in a plastic bag.  Place a towel between your skin and the bag.  Leave the ice on for 20 minutes, 2-3 times a day.  Use crutches and move about only as instructed by your health care provider.  Keep the leg elevated above the level of the heart (the center of the chest) at all times when not using the bathroom. Do not dangle the leg over a chair, couch, or bed. When lying down, elevate your leg on a few pillows. Elevation prevents swelling and reduces pain.  Avoid use other than gentle range of motion of the toes while the tendon is painful.  Do not drive a car until your health care provider specifically tells you it is safe to do so.  Take all medicines as directed by your health care provider.  Keep all follow-up visits with your health care provider. SEEK MEDICAL CARE IF:   Your pain and swelling increase or pain is uncontrolled with medicines.   You develop new, unexplained symptoms or your symptoms get worse.   You cannot move your toes or foot.  You develop warmth and swelling in your foot.  You have an unexplained fever.  MAKE SURE YOU:   Understand these instructions.  Will watch your condition.  Will get help  right away if you are not doing well or get worse.   This information is not intended to replace advice given to you by your health care provider. Make sure you discuss any questions you have with your health care provider.   Document Released: 12/16/2004 Document Revised: 07/23/2014 Document Reviewed: 10/27/2012 Elsevier Interactive Patient Education [employer].

## 2015-11-26 ENCOUNTER — Encounter (HOSPITAL_COMMUNITY): Payer: Self-pay | Admitting: Emergency Medicine

## 2015-11-26 ENCOUNTER — Ambulatory Visit (INDEPENDENT_AMBULATORY_CARE_PROVIDER_SITE_OTHER): Payer: Medicaid Other

## 2015-11-26 ENCOUNTER — Ambulatory Visit (HOSPITAL_COMMUNITY)
Admission: EM | Admit: 2015-11-26 | Discharge: 2015-11-26 | Disposition: A | Discharge status: Home / Self Care | Payer: Medicaid Other | Attending: Emergency Medicine | Admitting: Emergency Medicine

## 2015-11-26 DIAGNOSIS — S93402A Sprain of unspecified ligament of left ankle, initial encounter: Secondary | ICD-10-CM | POA: Diagnosis not present

## 2015-11-26 HISTORY — DX: Essential (primary) hypertension: I10

## 2015-11-26 HISTORY — DX: Disorder of thyroid, unspecified: E07.9

## 2015-11-26 MED ORDER — HYDROCODONE-ACETAMINOPHEN 5-325 MG PO TABS: 1.0000 | ORAL_TABLET | ORAL | 0 refills | Status: DC | PRN

## 2015-11-26 MED ORDER — IBUPROFEN 800 MG PO TABS: 800.0000 mg | ORAL_TABLET | Freq: Three times a day (TID) | ORAL | 0 refills | Status: DC

## 2015-11-26 NOTE — Discharge Instructions (Signed)
Crutches, ice 15 min at a time. Elevate as much as possible.

## 2015-11-26 NOTE — ED Triage Notes (Signed)
Patient was changing a tire today, wearing flip -flops and rolled ankle inside.  Patient has aching just below the inside portion of left ankle.  The area under heel radiating up the back of her foot to lower leg is burning sensation.  Patient has had partial achilles tears in the past and says this burning pain feels like this. Reports ankle pain is better if no pressure is applied to bottom of foot.  Reports if foot is dangling, pain is much better.

## 2015-11-26 NOTE — ED Provider Notes (Signed)
HPI  SUBJECTIVE:  Yvonne Olson is a 35 y.o. female who presents with dull, achy, constant left medial ankle pain after she states that she rolled her ankle inwards while wearing flip-flops in the rain. She denies hearing a "pop". She reports burning pain at the insertion of the Achilles tendon which is similar to when she tore her Achilles tendon previously and pressure with standing. Symptoms worse with standing, palpation, no alleviating factors. She tried 3 Excedrin without much improvement. She denies numbness, detailing, bruising. She does report swelling posteriorally to the medial malleolus. She has a past medical history of diabetes, hypertension, partial tear left Achilles tendon 2 which was nonoperatively managed initially, but states that she was told that she will need surgery on it at some point. LMP: Now. PMD: [facility]. Orthopedics: [facility]. Patient states she has crutches at home.    Past Medical History:  Diagnosis Date  . Cancer ([place])   . Diabetes mellitus without complication ([place])   . Hypertension   . Thyroid disease     Past Surgical History:  Procedure Laterality Date  . CESAREAN SECTION    . CHOLECYSTECTOMY    . TUBAL LIGATION      Family History  Problem Relation Age of Onset  . Cancer Other   . Diabetes Other   . CAD Other     Social History  Substance Use Topics  . Smoking status: Current Every Day Smoker  . Smokeless tobacco: Never Used  . Alcohol use Yes    No current facility-administered medications for this encounter.   Current Outpatient Prescriptions:  .  aspirin-acetaminophen-caffeine (EXCEDRIN MIGRAINE) 250-250-65 MG tablet, Take by mouth every 6 (six) hours as needed for headache., Disp: , Rfl:  .  HYDROcodone-acetaminophen (NORCO/VICODIN) 5-325 MG tablet, Take 1-2 tablets by mouth every 4 (four) hours as needed for moderate pain., Disp: 20 tablet, Rfl: 0 .  ibuprofen (ADVIL,MOTRIN) 800 MG tablet,  Take 1 tablet (800 mg total) by mouth 3 (three) times daily., Disp: 30 tablet, Rfl: 0  No Known Allergies   ROS  As noted in HPI.   Physical Exam  BP (!) 159/105 (BP Location: Right Arm)   Pulse 82   Temp 99.1 F (37.3 C) (Oral)   Resp 22   LMP 11/17/2015 (Approximate)   SpO2 99%   Constitutional: Well developed, well nourished, no acute distress Eyes:  EOMI, conjunctiva normal bilaterally HENT: Normocephalic, atraumatic,mucus membranes moist Respiratory: Normal inspiratory effort Cardiovascular: Normal rate GI: nondistended skin: No rash, skin intact Musculoskeletal: L Ankle Distal fibula NT, Medial malleolus mildly tender, Deltoid ligament medially  tender, positive soft tissue swelling posterior and inferior to medial malleolus.  Lateral ligaments NT, ATFL laterally NT, posterior tablofibular ligament laterally tender , Achilles mildly tender at the insertion, no palpable soft tissue defect, bruising or swelling. Thompson test negative. calcaneus  NT,  Proximal 5th metatarsal NT, Midfoot NT, distal NVI with baseline sensation / motor to foot with CR<2 seconds. - bruising.  Pt not able to bear weight in dept.  Neurologic: Alert & oriented x 3, no focal neuro deficits Psychiatric: Speech and behavior appropriate   ED Course   Medications - No data to display  Orders Placed This Encounter  Procedures  . DG Ankle Complete Left    Standing Status:   Standing    Number of Occurrences:   1    Order Specific Question:   Reason for Exam (SYMPTOM  OR DIAGNOSIS REQUIRED)  Answer:   twisted ankle.  ankle aching, burning under heel    Order Specific Question:   Is patient pregnant?    Answer:   No    Comments:   tubal ligation  . Apply ASO ankle    Standing Status:   Standing    Number of Occurrences:   1    Order Specific Question:   Laterality    Answer:   Left    No results found for this or any previous visit (from the past 24 hour(s)). Dg Ankle Complete  Left  Result Date: 11/26/2015 CLINICAL DATA:  35 year old female with twisting of the left ankle. EXAM: LEFT ANKLE COMPLETE - 3+ VIEW COMPARISON:  Left foot radiograph dated 01/07/2015 FINDINGS: There is no acute fracture or dislocation. Apparent linear lucency along the lateral malleolus at the level of the tibial plafond seen on the frontal projection is likely artifactual and related to focal sclerotic changes of the medial cortex of the lateral malleolus. No soft tissue swelling noted over the this area. Well corticated small bone fragments adjacent to the base of the fifth metatarsal likely related to chronic avulsion injury. There is no dislocation. The ankle mortise is intact. Bone spurring noted from the plantar aspect of the calcaneus as well as at the insertion of the Achilles tendon. There is no joint effusion. The soft tissues appear unremarkable. No radiopaque foreign object. IMPRESSION: No acute fracture or dislocation. Electronically Signed   By: Anner Crete M.D.   On: 11/26/2015 20:35    ED Clinical Impression  Left ankle sprain, initial encounter   ED Assessment/Plan  Reviewed imaging independently. No effusion. No acute fracture or dislocation. See radiology report for details.  Placed in an ASO. Home with Norco, ibuprofen. Ice for 15-20 minutes at a time, elevate as much as possible. Has crutches at home. She will follow up with [facility] orthopedics in a week for reevaluation and possible physical therapy. ' Discussed  imaging, MDM, plan and followup with patient . Discussed sn/sx that should prompt return to the ED. Patient  agrees with plan.   *This clinic note was created using Dragon dictation software. Therefore, there may be occasional mistakes despite careful proofreading.  ?   Melynda Ripple, MD 11/26/15 2129

## 2018-02-14 DIAGNOSIS — D509 Iron deficiency anemia, unspecified: Secondary | ICD-10-CM | POA: Insufficient documentation

## 2018-02-14 HISTORY — DX: Iron deficiency anemia, unspecified: D50.9

## 2018-03-01 DIAGNOSIS — N921 Excessive and frequent menstruation with irregular cycle: Secondary | ICD-10-CM

## 2018-03-01 DIAGNOSIS — D500 Iron deficiency anemia secondary to blood loss (chronic): Secondary | ICD-10-CM | POA: Insufficient documentation

## 2018-03-01 HISTORY — DX: Excessive and frequent menstruation with irregular cycle: N92.1

## 2018-03-01 HISTORY — DX: Iron deficiency anemia secondary to blood loss (chronic): D50.0

## 2020-09-08 ENCOUNTER — Ambulatory Visit (HOSPITAL_COMMUNITY)
Admission: EM | Admit: 2020-09-08 | Discharge: 2020-09-08 | Disposition: A | Discharge status: Home / Self Care | Payer: Medicaid Other

## 2020-09-08 ENCOUNTER — Encounter (HOSPITAL_COMMUNITY): Payer: Self-pay | Admitting: Emergency Medicine

## 2020-09-08 ENCOUNTER — Other Ambulatory Visit: Payer: Self-pay

## 2020-09-08 DIAGNOSIS — Z79899 Other long term (current) drug therapy: Secondary | ICD-10-CM | POA: Insufficient documentation

## 2020-09-08 DIAGNOSIS — Z859 Personal history of malignant neoplasm, unspecified: Secondary | ICD-10-CM | POA: Insufficient documentation

## 2020-09-08 DIAGNOSIS — Z7982 Long term (current) use of aspirin: Secondary | ICD-10-CM | POA: Diagnosis not present

## 2020-09-08 DIAGNOSIS — G8929 Other chronic pain: Secondary | ICD-10-CM | POA: Insufficient documentation

## 2020-09-08 DIAGNOSIS — I160 Hypertensive urgency: Secondary | ICD-10-CM | POA: Diagnosis not present

## 2020-09-08 DIAGNOSIS — R070 Pain in throat: Secondary | ICD-10-CM | POA: Diagnosis not present

## 2020-09-08 DIAGNOSIS — M542 Cervicalgia: Secondary | ICD-10-CM | POA: Insufficient documentation

## 2020-09-08 DIAGNOSIS — R42 Dizziness and giddiness: Secondary | ICD-10-CM | POA: Insufficient documentation

## 2020-09-08 DIAGNOSIS — E119 Type 2 diabetes mellitus without complications: Secondary | ICD-10-CM | POA: Insufficient documentation

## 2020-09-08 DIAGNOSIS — R053 Chronic cough: Secondary | ICD-10-CM | POA: Insufficient documentation

## 2020-09-08 DIAGNOSIS — R29818 Other symptoms and signs involving the nervous system: Secondary | ICD-10-CM

## 2020-09-08 DIAGNOSIS — R221 Localized swelling, mass and lump, neck: Secondary | ICD-10-CM | POA: Diagnosis not present

## 2020-09-08 DIAGNOSIS — I10 Essential (primary) hypertension: Secondary | ICD-10-CM | POA: Insufficient documentation

## 2020-09-08 DIAGNOSIS — F17200 Nicotine dependence, unspecified, uncomplicated: Secondary | ICD-10-CM | POA: Insufficient documentation

## 2020-09-08 DIAGNOSIS — R030 Elevated blood-pressure reading, without diagnosis of hypertension: Secondary | ICD-10-CM | POA: Diagnosis present

## 2020-09-08 MED ORDER — HYDROCODONE-ACETAMINOPHEN 5-325 MG PO TABS
ORAL_TABLET | ORAL | Status: AC
  Filled 2020-09-08: qty 1

## 2020-09-08 MED ORDER — HYDROCODONE-ACETAMINOPHEN 5-325 MG PO TABS
1.0000 | ORAL_TABLET | Freq: Once | ORAL | Status: AC
  Administered 2020-09-08: 1 via ORAL

## 2020-09-08 NOTE — ED Triage Notes (Signed)
Patient reports having a knot/painful area to right side of neck.  Patient gets lightheaded with coughing and pressure in head.  Patient is a smoker.  Pain in right side of neck with coughing.    Has taken benadryl and amoxicillin (left over- 5 days worth)

## 2020-09-08 NOTE — Discharge Instructions (Addendum)
Please report to the emergency room now. You have declined transport by EMS. Please have your father drive you to the ER now. Unfortunately you have severely elevated blood pressure with significant dizziness. You also have significant throat swelling and discoloration that needs to have a CT scan of the neck, bloodwork. Do not go home, head straight to the emergency room.

## 2020-09-08 NOTE — ED Provider Notes (Signed)
[place]   MRN: [id number] DOB: 1981/02/05  Subjective:   Yvonne Olson is a 40 y.o. female presenting for 1 month history of acute onset persistent and worsening throat pain, throat swelling, lightheadedness and dizziness.  Patient states that she previously went to a different emergency room and unfortunately left without being seen.  She has a history of diabetes, thyroid disease, high blood pressure but is not on any medications for these conditions now.  She has been smoking heavily for many years, has greater than 20-pack-year history.  Due to the throat pain she has some throat swelling she has not been able to smoke as much.  Denies chest pain, shortness of breath, weakness, confusion, numbness or tingling.  Denies history of stroke, MI.  No current facility-administered medications for this encounter.  Current Outpatient Medications:    acetaminophen (TYLENOL) 325 MG tablet, Take 650 mg by mouth every 6 (six) hours as needed., Disp: , Rfl:    aspirin EC 81 MG tablet, Take 81 mg by mouth daily. Swallow whole., Disp: , Rfl:    ibuprofen (ADVIL,MOTRIN) 800 MG tablet, Take 1 tablet (800 mg total) by mouth 3 (three) times daily., Disp: 30 tablet, Rfl: 0   aspirin-acetaminophen-caffeine (EXCEDRIN MIGRAINE) 250-250-65 MG tablet, Take by mouth every 6 (six) hours as needed for headache., Disp: , Rfl:    HYDROcodone-acetaminophen (NORCO/VICODIN) 5-325 MG tablet, Take 1-2 tablets by mouth every 4 (four) hours as needed for moderate pain., Disp: 20 tablet, Rfl: 0   No Known Allergies  Past Medical History:  Diagnosis Date   Cancer ([place])    Diabetes mellitus without complication ([place])    Hypertension    Thyroid disease      Past Surgical History:  Procedure Laterality Date   CESAREAN SECTION     CHOLECYSTECTOMY     TUBAL LIGATION      Family History  Problem Relation Age of Onset   Cancer Other    Diabetes Other    CAD Other     Social History    Tobacco Use   Smoking status: Every Day    Pack years: 0.00   Smokeless tobacco: Never  Substance Use Topics   Alcohol use: Yes   Drug use: No    ROS   Objective:   Vitals: BP (!) 181/100 (BP Location: Right Arm) Comment (BP Location): repositioned arm  Pulse 72   Temp 98.6 F (37 C) (Oral)   Resp 20   LMP 11/17/2015 (Approximate)   SpO2 100%   BP Readings from Last 3 Encounters:  09/08/20 (!) 181/100  11/26/15 (!) 159/105  12/29/14 132/67   BP recheck was 205/111.   Physical Exam Constitutional:      General: She is not in acute distress.    Appearance: Normal appearance. She is well-developed. She is not ill-appearing, toxic-appearing or diaphoretic.  HENT:     Head: Normocephalic and atraumatic.     Nose: Nose normal.     Mouth/Throat:     Mouth: Mucous membranes are moist.  Eyes:     Extraocular Movements: Extraocular movements intact.     Pupils: Pupils are equal, round, and reactive to light.  Neck:   Cardiovascular:     Rate and Rhythm: Normal rate and regular rhythm.     Pulses: Normal pulses.     Heart sounds: Normal heart sounds. No murmur heard.   No friction rub. No gallop.  Pulmonary:     Effort: Pulmonary  effort is normal. No respiratory distress.     Breath sounds: Normal breath sounds. No stridor. No wheezing, rhonchi or rales.  Skin:    General: Skin is warm and dry.     Findings: No rash.  Neurological:     Mental Status: She is alert and oriented to person, place, and time.     Cranial Nerves: No cranial nerve deficit.     Motor: No weakness.     Coordination: Coordination normal.     Gait: Gait normal.     Deep Tendon Reflexes: Reflexes normal.  Psychiatric:        Mood and Affect: Mood normal.        Behavior: Behavior normal.        Thought Content: Thought content normal.      Assessment and Plan :   PDMP not reviewed this encounter.  1. Throat pain   2. Throat swelling   3. Hypertensive urgency   4. Positive  Romberg test     Patient is in need of a higher level of care than we can provide in the urgent care setting.  My concern is that patient has an underlying malignancy of the neck given her history of smoking and current symptoms set, physical exam findings.  However, she also has severely elevated blood pressure and is symptomatic from this as well including dizziness and lightheadedness.  She will likely need to restart blood pressure medications but is more pressing to have a neck CT scan given her swelling.  Discussed this with patient at length, declined transport by EMS.  She will have her father take her to the hospital now.  Patient given hydrocodone in clinic for her pain while she waits to be seen in the emergency room.   Jaynee Eagles, [place] 09/08/20 1949

## 2020-09-08 NOTE — ED Notes (Signed)
Patient is being discharged from the Urgent Care and sent to the Emergency Department via pov/father driving her . Per [place], [place], patient is in need of higher level of care due to need for a higher level of care. Patient is aware and verbalizes understanding of plan of care.  Vitals:   09/08/20 1906 09/08/20 1909  BP: (!) 198/101 (!) 181/100  Pulse: 72   Resp: 20   Temp: 98.6 F (37 C)   SpO2: 100%

## 2020-09-09 ENCOUNTER — Emergency Department (HOSPITAL_COMMUNITY): Payer: Medicaid Other

## 2020-09-09 ENCOUNTER — Emergency Department (HOSPITAL_COMMUNITY)
Admission: EM | Admit: 2020-09-09 | Discharge: 2020-09-09 | Disposition: A | Discharge status: Home / Self Care | Payer: Medicaid Other | Attending: Emergency Medicine | Admitting: Emergency Medicine

## 2020-09-09 DIAGNOSIS — M542 Cervicalgia: Secondary | ICD-10-CM

## 2020-09-09 DIAGNOSIS — I10 Essential (primary) hypertension: Secondary | ICD-10-CM

## 2020-09-09 LAB — I-STAT BETA HCG BLOOD, ED (MC, WL, AP ONLY): I-stat hCG, quantitative: <5 m[IU]/mL (ref <5)

## 2020-09-09 LAB — CBC WITH DIFFERENTIAL/PLATELET
Abs Immature Granulocytes: 0.04 10*3/uL (ref 0.00–0.07)
Basophils Absolute: 0 10*3/uL (ref 0.0–0.1)
Basophils Relative: 0 %
Eosinophils Absolute: 0.1 10*3/uL (ref 0.0–0.5)
Eosinophils Relative: 1 %
HCT: 43.2 % (ref 36.0–46.0)
Hemoglobin: 14.5 g/dL (ref 12.0–15.0)
Immature Granulocytes: 1 %
Lymphocytes Relative: 31 %
Lymphs Abs: 2.6 10*3/uL (ref 0.7–4.0)
MCH: 34.3 pg — ABNORMAL HIGH (ref 26.0–34.0)
MCHC: 33.6 g/dL (ref 30.0–36.0)
MCV: 102.1 fL — ABNORMAL HIGH (ref 80.0–100.0)
Monocytes Absolute: 0.5 10*3/uL (ref 0.1–1.0)
Monocytes Relative: 6 %
Neutro Abs: 5 10*3/uL (ref 1.7–7.7)
Neutrophils Relative %: 61 %
Platelets: 149 10*3/uL — ABNORMAL LOW (ref 150–400)
RBC: 4.23 MIL/uL (ref 3.87–5.11)
RDW: 12.4 % (ref 11.5–15.5)
WBC: 8.3 10*3/uL (ref 4.0–10.5)
nRBC: 0 % (ref 0.0–0.2)

## 2020-09-09 LAB — BASIC METABOLIC PANEL
Anion gap: 11 (ref 5–15)
BUN: 14 mg/dL (ref 6–20)
CO2: 23 mmol/L (ref 22–32)
Calcium: 9.3 mg/dL (ref 8.9–10.3)
Chloride: 100 mmol/L (ref 98–111)
Creatinine, Ser: 0.71 mg/dL (ref 0.44–1.00)
GFR, Estimated: >60 mL/min (ref >60)
Glucose, Bld: 245 mg/dL — ABNORMAL HIGH (ref 70–99)
Potassium: 3.9 mmol/L (ref 3.5–5.1)
Sodium: 134 mmol/L — ABNORMAL LOW (ref 135–145)

## 2020-09-09 LAB — TSH: TSH: 2.631 u[IU]/mL (ref 0.350–4.500)

## 2020-09-09 MED ORDER — CYCLOBENZAPRINE HCL 10 MG PO TABS: 10.0000 mg | ORAL_TABLET | Freq: Two times a day (BID) | ORAL | 0 refills | Status: DC | PRN

## 2020-09-09 MED ORDER — IOHEXOL 300 MG/ML  SOLN
75.0000 mL | Freq: Once | INTRAMUSCULAR | Status: AC | PRN
  Administered 2020-09-09: 75 mL via INTRAVENOUS

## 2020-09-09 MED ORDER — HYDROCHLOROTHIAZIDE 25 MG PO TABS: 25.0000 mg | ORAL_TABLET | Freq: Every day | ORAL | 0 refills | Status: DC

## 2020-09-09 MED ORDER — IBUPROFEN 600 MG PO TABS: 600.0000 mg | ORAL_TABLET | Freq: Four times a day (QID) | ORAL | 0 refills | Status: DC | PRN

## 2020-09-09 MED ORDER — LISINOPRIL 10 MG PO TABS: 10.0000 mg | ORAL_TABLET | Freq: Every day | ORAL | 0 refills | Status: DC

## 2020-09-09 NOTE — ED Notes (Signed)
Patient transported to CT 

## 2020-09-09 NOTE — ED Provider Notes (Signed)
[facility] EMERGENCY DEPARTMENT Provider Note   CSN: [id number] Arrival date & time: 09/08/20  1953     History Chief Complaint  Patient presents with   Oral Swelling   Dizziness   Hypertension    Yvonne Olson is a 40 y.o. female.  HPI     This a 40 year old female with a history of diabetes, hypertension, thyroid disease who presents with sore throat and swelling.  Patient states over the last 4 to 5 weeks she has had progressively worsening right-sided neck swelling and pain.  She states the pain shoots up from her lower neck into her right jaw.  She has been swallowing without difficulty but occasionally has painful swallowing.  She also has had some dizziness and lightheadedness.  She reports a chronic nonproductive cough.  She is not ongoing smoker.  No fevers.  Patient reports fluctuations in her weight and heat intolerance.  She has a history of thyroid disease but is not on thyroid medication.  Patient was seen and evaluated in urgent care.  She was referred here for imaging and evaluation for elevated blood pressures.  She is not currently on any antihypertensive.  Denies chest pain, shortness breath, abdominal pain, nausea, vomiting.  Past Medical History:  Diagnosis Date   Cancer ([place])    Diabetes mellitus without complication ([place])    Hypertension    Thyroid disease     There are no problems to display for this patient.   Past Surgical History:  Procedure Laterality Date   CESAREAN SECTION     CHOLECYSTECTOMY     TUBAL LIGATION       OB History     Gravida  3   Para  3   Term  3   Preterm      AB      Living  3      SAB      IAB      Ectopic      Multiple      Live Births              Family History  Problem Relation Age of Onset   Cancer Other    Diabetes Other    CAD Other     Social History   Tobacco Use   Smoking status: Every Day    Pack years: 0.00   Smokeless tobacco: Never  Substance  Use Topics   Alcohol use: Yes   Drug use: No    Home Medications Prior to Admission medications   Medication Sig Start Date End Date Taking? Authorizing Provider  cyclobenzaprine (FLEXERIL) 10 MG tablet Take 1 tablet (10 mg total) by mouth 2 (two) times daily as needed for muscle spasms. 09/09/20  Yes Hawthorne Day, Barbette Hair, MD  hydrochlorothiazide (HYDRODIURIL) 25 MG tablet Take 1 tablet (25 mg total) by mouth daily. 09/09/20  Yes Alanah Sakuma, Barbette Hair, MD  ibuprofen (ADVIL) 600 MG tablet Take 1 tablet (600 mg total) by mouth every 6 (six) hours as needed. 09/09/20  Yes Muntaha Vermette, Barbette Hair, MD  lisinopril (ZESTRIL) 10 MG tablet Take 1 tablet (10 mg total) by mouth daily. 09/09/20  Yes Lanae Federer, Barbette Hair, MD  acetaminophen (TYLENOL) 325 MG tablet Take 650 mg by mouth every 6 (six) hours as needed.    [provider]  aspirin EC 81 MG tablet Take 81 mg by mouth daily. Swallow whole.    [provider]  aspirin-acetaminophen-caffeine (EXCEDRIN MIGRAINE) 4010924665 MG tablet Take by  mouth every 6 (six) hours as needed for headache.    [provider]  HYDROcodone-acetaminophen (NORCO/VICODIN) 5-325 MG tablet Take 1-2 tablets by mouth every 4 (four) hours as needed for moderate pain. 11/26/15   Melynda Ripple, MD  ibuprofen (ADVIL,MOTRIN) 800 MG tablet Take 1 tablet (800 mg total) by mouth 3 (three) times daily. 11/26/15   Melynda Ripple, MD    Allergies    Patient has no known allergies.  Review of Systems   Review of Systems  Constitutional:  Negative for fever.  HENT:  Positive for sore throat. Negative for trouble swallowing.   Respiratory:  Positive for cough. Negative for shortness of breath.   Cardiovascular:  Negative for chest pain.  Genitourinary:  Negative for dysuria.  Musculoskeletal:  Positive for neck pain. Negative for neck stiffness.  Neurological:  Positive for light-headedness. Negative for dizziness and numbness.   Physical Exam Updated Vital  Signs BP (!) 183/99   Pulse 82   Temp 98.4 F (36.9 C) (Oral)   Resp 20   Ht 1.803 m (5\' 11" )   Wt 93 kg   LMP 11/17/2015 (Approximate)   SpO2 99%   BMI 28.59 kg/m   Physical Exam Vitals and nursing note reviewed.  Constitutional:      Appearance: She is well-developed. She is obese. She is not ill-appearing.  HENT:     Head: Normocephalic and atraumatic.     Nose: Nose normal.     Mouth/Throat:     Comments: Tenderness to palpation over the right neck, no mass noted, no pulsatility noted, slight swelling appreciated, posterior oropharynx clear, no masses, uvula midline, no tonsillar exudate or swelling, no trismus, normal phonation Eyes:     Pupils: Pupils are equal, round, and reactive to light.  Cardiovascular:     Rate and Rhythm: Normal rate and regular rhythm.     Heart sounds: Normal heart sounds.  Pulmonary:     Effort: Pulmonary effort is normal. No respiratory distress.     Breath sounds: No stridor. No wheezing.  Abdominal:     Palpations: Abdomen is soft.     Tenderness: There is no abdominal tenderness.  Musculoskeletal:     Cervical back: Neck supple.     Right lower leg: No edema.     Left lower leg: No edema.  Skin:    General: Skin is warm and dry.  Neurological:     Mental Status: She is alert and oriented to person, place, and time.  Psychiatric:        Mood and Affect: Mood normal.    ED Results / Procedures / Treatments   Labs (all labs ordered are listed, but only abnormal results are displayed) Labs Reviewed  CBC WITH DIFFERENTIAL/PLATELET - Abnormal; Notable for the following components:      Result Value   MCV 102.1 (*)    MCH 34.3 (*)    Platelets 149 (*)    All other components within normal limits  BASIC METABOLIC PANEL - Abnormal; Notable for the following components:   Sodium 134 (*)    Glucose, Bld 245 (*)    All other components within normal limits  TSH  I-STAT BETA HCG BLOOD, ED ([facility], [facility], [facility] ONLY)     EKG None  Radiology CT Soft Tissue Neck W Contrast  Result Date: 09/09/2020 CLINICAL DATA:  Neck mass, swelling. EXAM: CT NECK WITH CONTRAST TECHNIQUE: Multidetector CT imaging of the neck was performed using the standard protocol following the bolus  administration of intravenous contrast. CONTRAST:  39mL OMNIPAQUE IOHEXOL 300 MG/ML  SOLN COMPARISON:  None. FINDINGS: Pharynx and larynx: Normal. No mass or swelling. Salivary glands: No inflammation, mass, or stone. Thyroid: Normal. Lymph nodes: None enlarged or abnormal density. Vascular: Negative. Limited intracranial: Negative. Visualized orbits: Negative. Mastoids and visualized paranasal sinuses: Clear. Skeleton: No acute or aggressive process. Upper chest: Negative. IMPRESSION: Negative neck CT. Electronically Signed   By: Monte Fantasia M.D.   On: 09/09/2020 04:25    Procedures Procedures   Medications Ordered in ED Medications  iohexol (OMNIPAQUE) 300 MG/ML solution 75 mL (75 mLs Intravenous Contrast Given 09/09/20 0359)    ED Course  I have reviewed the triage vital signs and the nursing notes.  Pertinent labs & imaging results that were available during my care of the patient were reviewed by me and considered in my medical decision making (see chart for details).    MDM Rules/Calculators/A&P                          Patient presents with acute on chronic neck pain and slight swelling.  There is no mass noted on exam.  No trismus.  No oropharyngeal swelling or angioedema.  She is overall nontoxic.  Vital signs notable for elevated blood pressure.  She does report intermittent lightheadedness.  No chest pain.   She had previously been on blood pressure medications but is currently off of medication.  Labs sent from triage.  No significant metabolic derangements.  CBC is reassuring.  She is not pregnant.  Thyroid studies negative.  CT soft tissue neck obtained and shows no evidence of mass or other abnormality.  Patient's  symptoms are acute on chronic.  Given tenderness along the sternocleidomastoid, she may have some mild torticollis.  Will trial NSAIDs and Flexeril.  She was advised not to take Flexeril while driving.  She is significantly hypertensive but no evidence of hypertensive urgency or emergency.  She has no chest pain.  She does endorse occasional lightheadedness but none currently.  Will start on HCTZ and lisinopril.  She was encouraged to establish primary care follow-up.  Clinically stable at discharge.  After history, exam, and medical workup I feel the patient has been appropriately medically screened and is safe for discharge home. Pertinent diagnoses were discussed with the patient. Patient was given return precautions.  Final Clinical Impression(s) / ED Diagnoses Final diagnoses:  Primary hypertension  Neck pain    Rx / DC Orders ED Discharge Orders          Ordered    ibuprofen (ADVIL) 600 MG tablet  Every 6 hours PRN        09/09/20 0544    cyclobenzaprine (FLEXERIL) 10 MG tablet  2 times daily PRN        09/09/20 0544    hydrochlorothiazide (HYDRODIURIL) 25 MG tablet  Daily        09/09/20 0544    lisinopril (ZESTRIL) 10 MG tablet  Daily        09/09/20 0544             Merryl Hacker, MD 09/09/20 [phone]

## 2020-09-09 NOTE — Discharge Instructions (Addendum)
You were seen today for neck pain and swelling.  Your CT scan of your neck is negative for mass or other abnormality.  Given tenderness along the muscles of the neck, you could have some mild torticollis which can be related to sleeping awkwardly.  Trial ibuprofen and a muscle relaxer.  Do not drive while taking muscle relaxers.  Additionally, you were noted to be significantly hypertensive.  You need to reinitiate blood pressure medication and establish primary care for recheck and titration of medication.

## 2020-09-09 NOTE — ED Triage Notes (Signed)
Notable swelling to throat and neck.  Also reports severe dizziness and cough. Reports lump sensation in neck/throat

## 2020-09-09 NOTE — ED Provider Notes (Signed)
Emergency Medicine Provider Triage Evaluation Note  Yvonne Olson , a 40 y.o. female  was evaluated in triage.  Pt complains of neck swelling and pain.  Gradually worsening for several weeks.  Reports associated cough and dizziness.  Sent by [facility].  Very hypertensive in triage.  Review of Systems  Positive: Neck swelling  Negative: fever  Physical Exam  BP (!) 211/111 (BP Location: Left Arm)   Pulse 99   Temp 99.1 F (37.3 C) (Oral)   Resp 18   Ht 5\' 11"  (1.803 m)   Wt 93 kg   LMP 11/17/2015 (Approximate)   SpO2 100%   BMI 28.59 kg/m  Gen:   Awake, no distress   Resp:  Normal effort  MSK:   Moves extremities without difficulty  Other:    Medical Decision Making  Medically screening exam initiated at 1:17 AM.  Appropriate orders placed.  Brelyn Woehl Olson was informed that the remainder of the evaluation will be completed by another provider, this initial triage assessment does not replace that evaluation, and the importance of remaining in the ED until their evaluation is complete.     Montine Circle, PA-C 09/09/20 5670    Merryl Hacker, MD 09/09/20 [phone]

## 2021-01-20 DIAGNOSIS — M47814 Spondylosis without myelopathy or radiculopathy, thoracic region: Secondary | ICD-10-CM | POA: Insufficient documentation

## 2021-01-20 DIAGNOSIS — G8929 Other chronic pain: Secondary | ICD-10-CM | POA: Insufficient documentation

## 2021-01-20 HISTORY — DX: Spondylosis without myelopathy or radiculopathy, thoracic region: M47.814

## 2021-01-20 HISTORY — DX: Other chronic pain: G89.29

## 2021-10-03 IMAGING — CT CT NECK W/ CM
4 of 5 series · 15 of 33 positions shown, 18 images · IV contrast (Omni 300)
Comparison: None.

CLINICAL DATA: Neck mass, swelling.

EXAM:
CT NECK WITH CONTRAST
TECHNIQUE: Multidetector CT imaging of the neck was performed using the
standard protocol following the bolus administration of intravenous
contrast.
CONTRAST:  75mL OMNIPAQUE IOHEXOL 300 MG/ML  SOLN

[Series 3: neck 2.0 st · axial · 0.51mm/px · z∈[-135,+9]mm · 5 of 110 slices shown, 7 images]
[im 19/110  soft-tissue]
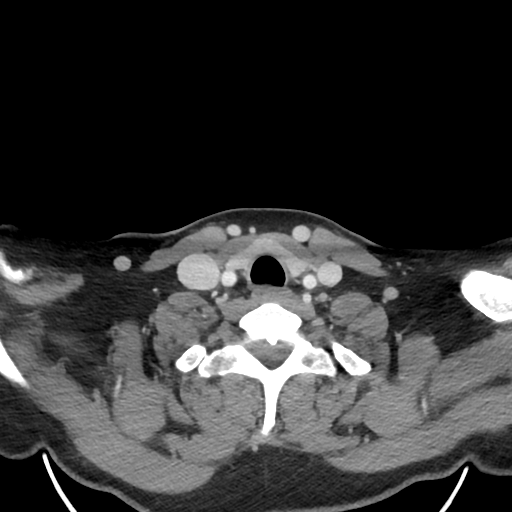
[im 19/110  bone]
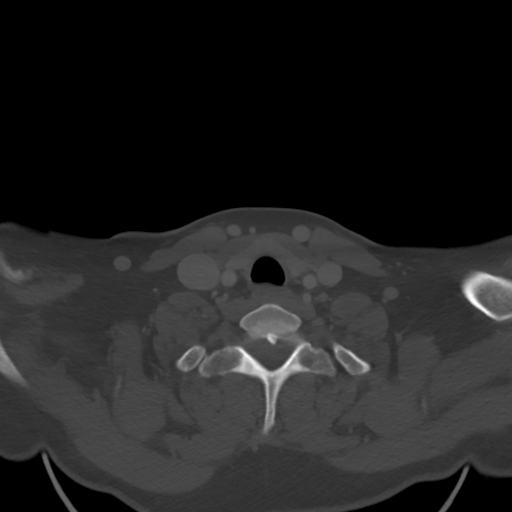
[im 37/110  bone]
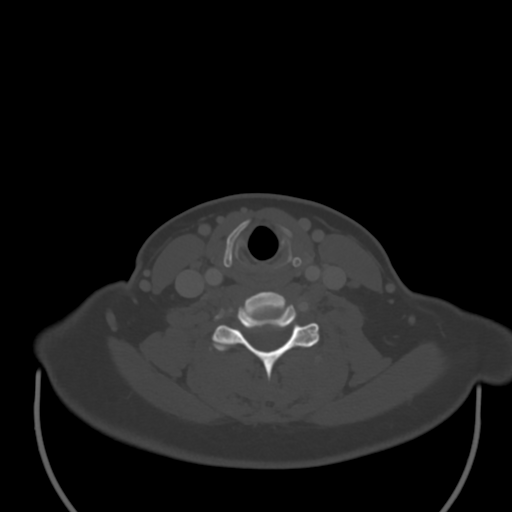
[im 55/110  bone]
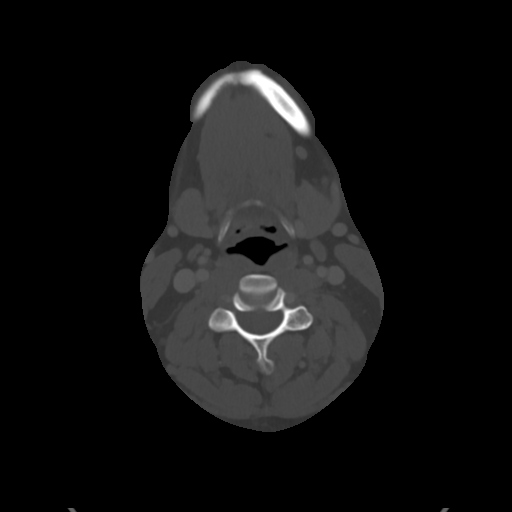
[im 73/110  bone]
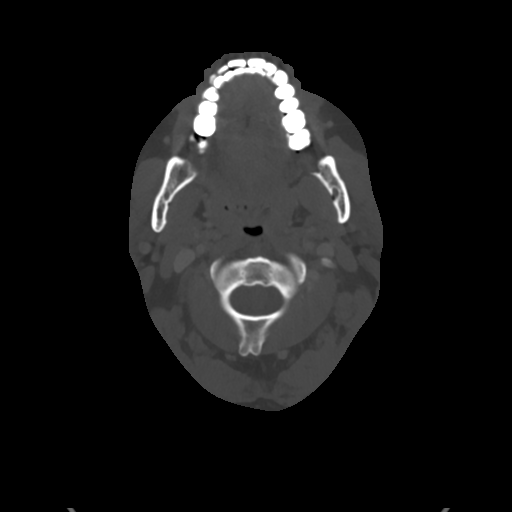
[im 91/110  soft-tissue]
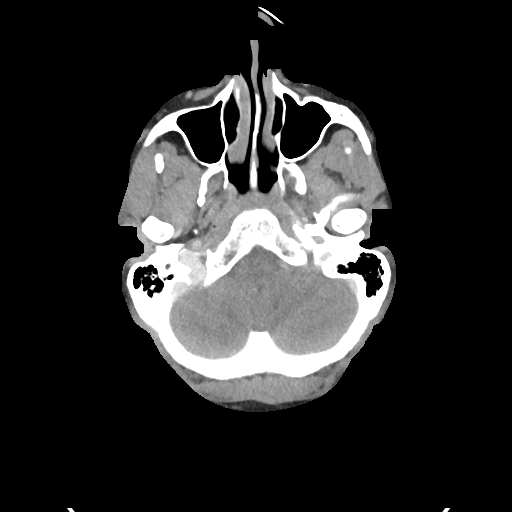
[im 91/110  bone]
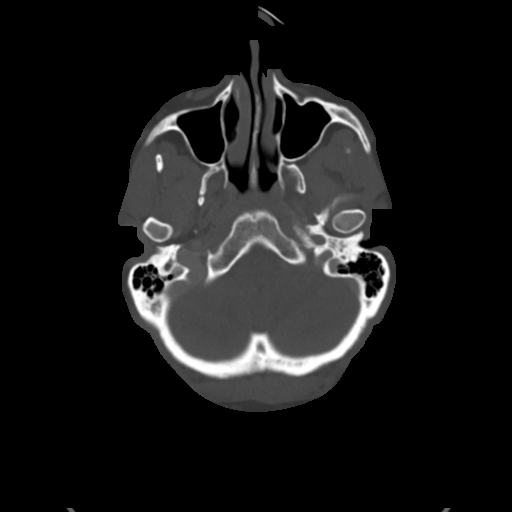

[Series 5: sagittal · sagittal · 0.43mm/px · 5 of 101 slices shown, 6 images]
[im 34/101  bone]
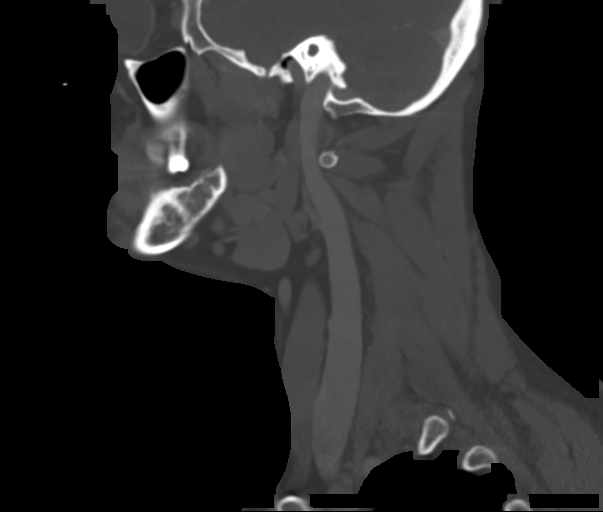
[im 42/101  bone]
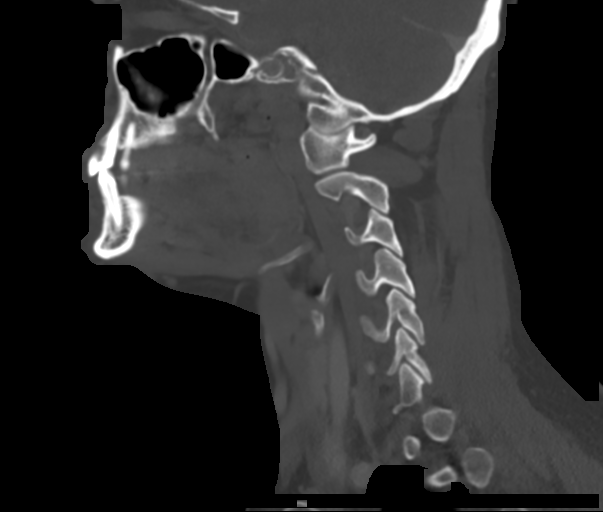
[im 51/101  soft-tissue]
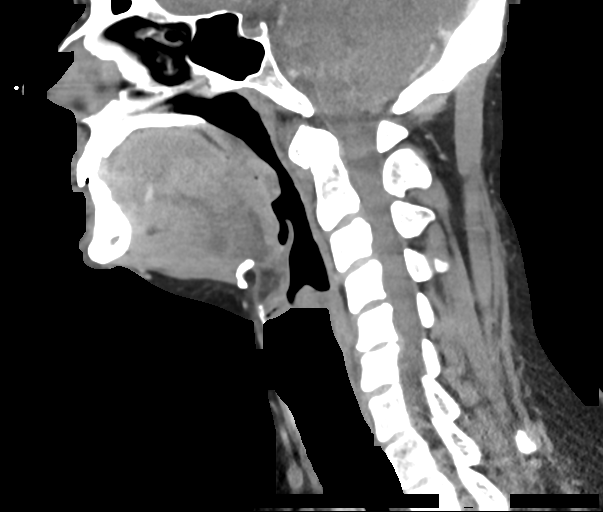
[im 51/101  bone]
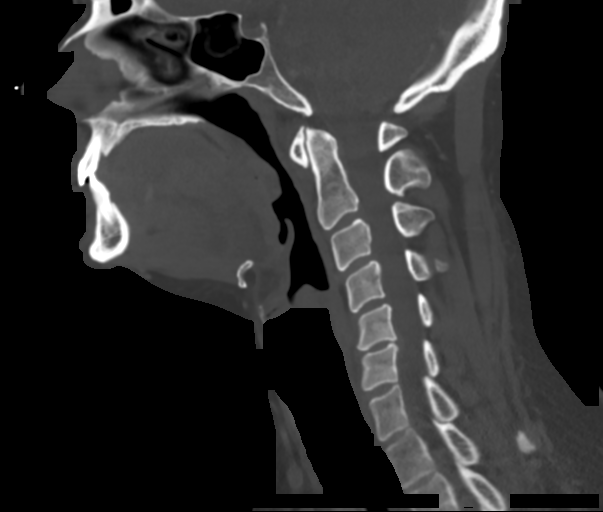
[im 59/101  bone]
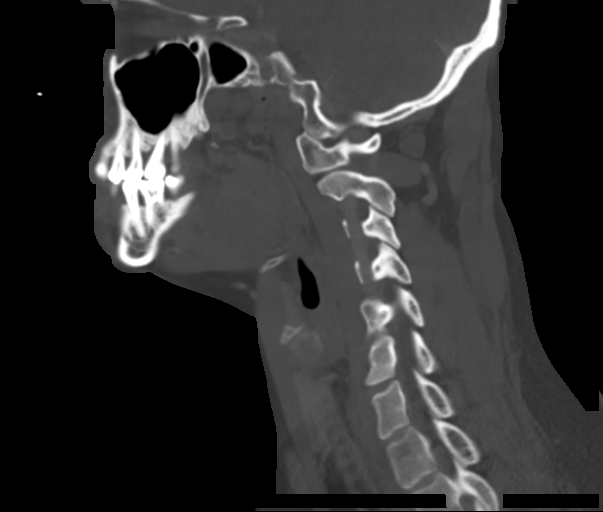
[im 67/101  bone]
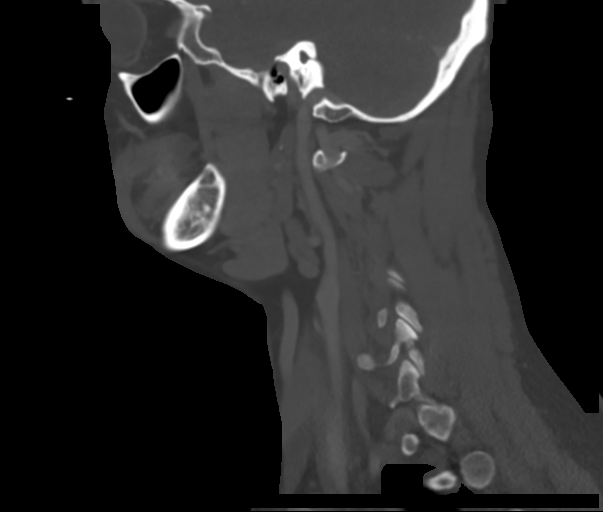

[Series 6: coronal · coronal · 0.43mm/px · 3 of 121 slices shown]
[im 25/121  bone]
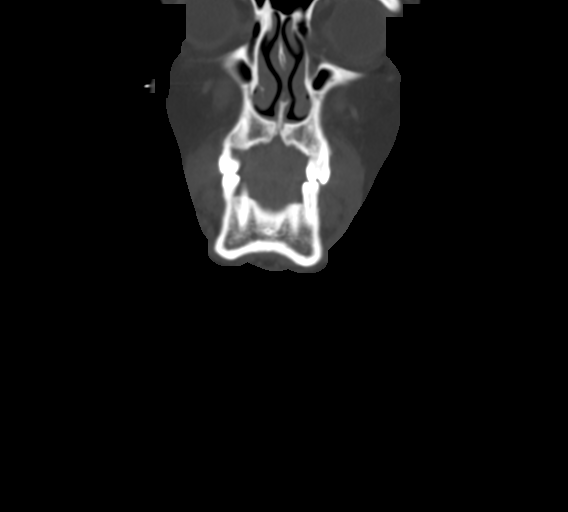
[im 49/121  bone]
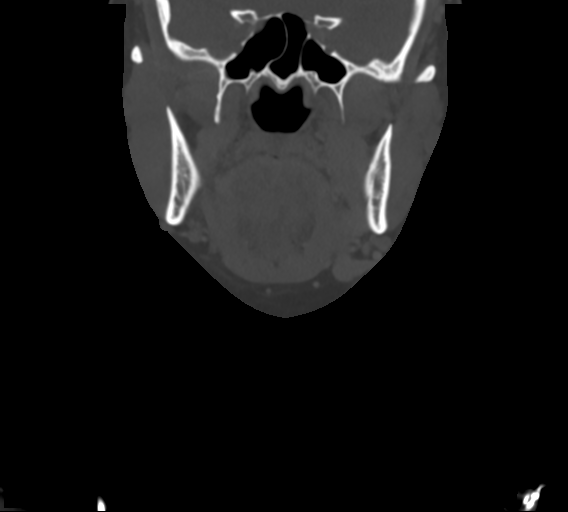
[im 73/121  bone]
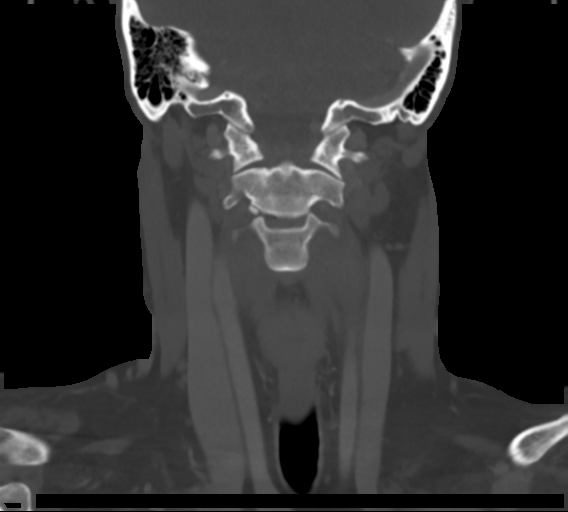

[Series 7: orthogonal · axial · 0.39mm/px · z∈[-130,-86]mm · 2 of 110 slices shown]
[im 22/110  bone]
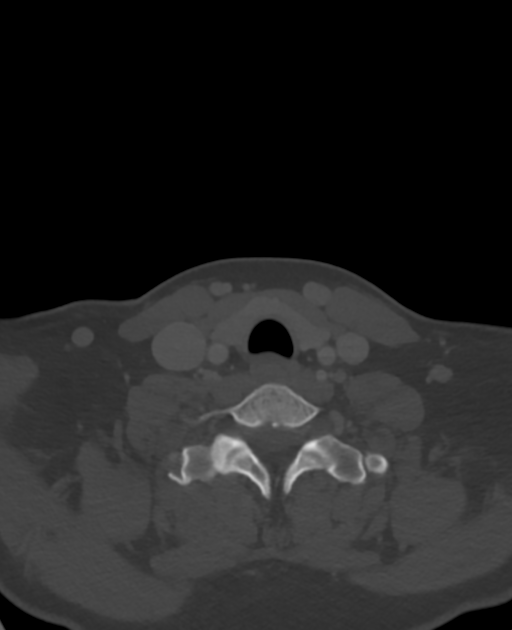
[im 44/110  bone]
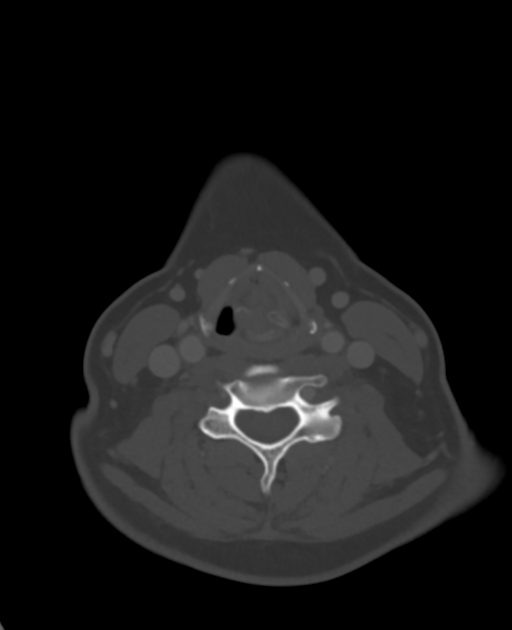

[15 of 33 positions shown; findings below may reference images not displayed]

FINDINGS: Pharynx and larynx: Normal. No mass or swelling.

Salivary glands: No inflammation, mass, or stone.

Thyroid: Normal.

Lymph nodes: None enlarged or abnormal density.

Vascular: Negative.

Limited intracranial: Negative.

Visualized orbits: Negative.

Mastoids and visualized paranasal sinuses: Clear.

Skeleton: No acute or aggressive process.

Upper chest: Negative.
IMPRESSION: Negative neck CT.

## 2023-04-06 ENCOUNTER — Emergency Department
Admission: EM | Admit: 2023-04-06 | Discharge: 2023-04-06 | Disposition: A | Discharge status: Home / Self Care | Payer: Medicaid Other | Attending: Emergency Medicine | Admitting: Emergency Medicine

## 2023-04-06 ENCOUNTER — Encounter: Payer: Self-pay | Admitting: Intensive Care

## 2023-04-06 ENCOUNTER — Other Ambulatory Visit: Payer: Self-pay

## 2023-04-06 DIAGNOSIS — R739 Hyperglycemia, unspecified: Secondary | ICD-10-CM

## 2023-04-06 DIAGNOSIS — I10 Essential (primary) hypertension: Secondary | ICD-10-CM | POA: Insufficient documentation

## 2023-04-06 DIAGNOSIS — R1013 Epigastric pain: Secondary | ICD-10-CM | POA: Insufficient documentation

## 2023-04-06 DIAGNOSIS — R112 Nausea with vomiting, unspecified: Secondary | ICD-10-CM | POA: Insufficient documentation

## 2023-04-06 DIAGNOSIS — E1165 Type 2 diabetes mellitus with hyperglycemia: Secondary | ICD-10-CM | POA: Insufficient documentation

## 2023-04-06 LAB — BLOOD GAS, VENOUS
Acid-base deficit: 1.8 mmol/L (ref 0.0–2.0)
Bicarbonate: 23.7 mmol/L (ref 20.0–28.0)
O2 Saturation: 50.4 %
Patient temperature: 37
pCO2, Ven: 42 mm[Hg] — ABNORMAL LOW (ref 44–60)
pH, Ven: 7.36 (ref 7.25–7.43)
pO2, Ven: <31 mm[Hg] — CL (ref 32–45)

## 2023-04-06 LAB — COMPREHENSIVE METABOLIC PANEL
ALT: 36 U/L (ref 0–44)
AST: 35 U/L (ref 15–41)
Albumin: 4.8 g/dL (ref 3.5–5.0)
Alkaline Phosphatase: 72 U/L (ref 38–126)
Anion gap: 11 (ref 5–15)
BUN: 17 mg/dL (ref 6–20)
CO2: 20 mmol/L — ABNORMAL LOW (ref 22–32)
Calcium: 9.4 mg/dL (ref 8.9–10.3)
Chloride: 100 mmol/L (ref 98–111)
Creatinine, Ser: 0.79 mg/dL (ref 0.44–1.00)
GFR, Estimated: >60 mL/min (ref >60)
Glucose, Bld: 260 mg/dL — ABNORMAL HIGH (ref 70–99)
Potassium: 4.9 mmol/L (ref 3.5–5.1)
Sodium: 131 mmol/L — ABNORMAL LOW (ref 135–145)
Total Bilirubin: 1 mg/dL (ref 0.0–1.2)
Total Protein: 8 g/dL (ref 6.5–8.1)

## 2023-04-06 LAB — URINALYSIS, ROUTINE W REFLEX MICROSCOPIC
Bacteria, UA: NONE SEEN
Bilirubin Urine: NEGATIVE
Glucose, UA: >=500 mg/dL — AB
Hgb urine dipstick: NEGATIVE
Ketones, ur: 20 mg/dL — AB
Leukocytes,Ua: NEGATIVE
Nitrite: NEGATIVE
Protein, ur: NEGATIVE mg/dL
Specific Gravity, Urine: 1.012 (ref 1.005–1.030)
pH: 5 (ref 5.0–8.0)

## 2023-04-06 LAB — CBC WITH DIFFERENTIAL/PLATELET
Abs Immature Granulocytes: 0.01 10*3/uL (ref 0.00–0.07)
Basophils Absolute: 0 10*3/uL (ref 0.0–0.1)
Basophils Relative: 0 %
Eosinophils Absolute: 0 10*3/uL (ref 0.0–0.5)
Eosinophils Relative: 0 %
HCT: 46 % (ref 36.0–46.0)
Hemoglobin: 15.6 g/dL — ABNORMAL HIGH (ref 12.0–15.0)
Immature Granulocytes: 0 %
Lymphocytes Relative: 21 %
Lymphs Abs: 1.4 10*3/uL (ref 0.7–4.0)
MCH: 33.8 pg (ref 26.0–34.0)
MCHC: 33.9 g/dL (ref 30.0–36.0)
MCV: 99.8 fL (ref 80.0–100.0)
Monocytes Absolute: 0.4 10*3/uL (ref 0.1–1.0)
Monocytes Relative: 6 %
Neutro Abs: 5.1 10*3/uL (ref 1.7–7.7)
Neutrophils Relative %: 73 %
Platelets: 176 10*3/uL (ref 150–400)
RBC: 4.61 MIL/uL (ref 3.87–5.11)
RDW: 11.8 % (ref 11.5–15.5)
WBC: 7 10*3/uL (ref 4.0–10.5)
nRBC: 0.4 % — ABNORMAL HIGH (ref 0.0–0.2)

## 2023-04-06 LAB — POC URINE PREG, ED: Preg Test, Ur: NEGATIVE

## 2023-04-06 LAB — CBG MONITORING, ED
Glucose-Capillary: 255 mg/dL — ABNORMAL HIGH (ref 70–99)
Glucose-Capillary: 157 mg/dL — ABNORMAL HIGH (ref 70–99)

## 2023-04-06 LAB — LIPASE, BLOOD: Lipase: 48 U/L (ref 11–51)

## 2023-04-06 LAB — BETA-HYDROXYBUTYRIC ACID: Beta-Hydroxybutyric Acid: 0.94 mmol/L — ABNORMAL HIGH (ref 0.05–0.27)

## 2023-04-06 MED ORDER — SODIUM CHLORIDE 0.9 % IV BOLUS
1000.0000 mL | Freq: Once | INTRAVENOUS | Status: AC
  Administered 2023-04-06: 1000 mL via INTRAVENOUS

## 2023-04-06 MED ORDER — BLOOD GLUCOSE MONITOR SYSTEM W/DEVICE KIT
1.0000 | PACK | Freq: Three times a day (TID) | 0 refills | Status: DC
Start: 2023-04-06 — End: 2023-10-05
  Filled 2023-04-06: qty 1, 1d supply, fill #0

## 2023-04-06 MED ORDER — LANCET DEVICE MISC
1.0000 | Freq: Three times a day (TID) | 0 refills | Status: AC
  Filled 2023-04-06: qty 1, 30d supply, fill #0

## 2023-04-06 MED ORDER — BLOOD GLUCOSE TEST VI STRP
1.0000 | ORAL_STRIP | Freq: Three times a day (TID) | 0 refills | Status: AC
  Filled 2023-04-06: qty 100, 34d supply, fill #0

## 2023-04-06 MED ORDER — ACCU-CHEK SOFTCLIX LANCETS MISC
1.0000 | Freq: Three times a day (TID) | 0 refills | Status: AC
  Filled 2023-04-06: qty 100, 30d supply, fill #0

## 2023-04-06 MED ORDER — ONDANSETRON HCL 4 MG/2ML IJ SOLN
4.0000 mg | Freq: Once | INTRAMUSCULAR | Status: AC
  Administered 2023-04-06: 4 mg via INTRAVENOUS
  Filled 2023-04-06: qty 2

## 2023-04-06 MED ORDER — AMLODIPINE BESYLATE 5 MG PO TABS: 5.0000 mg | ORAL_TABLET | Freq: Every day | ORAL | 5 refills | Status: DC

## 2023-04-06 MED ORDER — AMLODIPINE BESYLATE 5 MG PO TABS
5.0000 mg | ORAL_TABLET | Freq: Every day | ORAL | 5 refills | Status: DC
  Filled 2023-04-06: qty 30, 30d supply, fill #0
  Filled 2023-05-26: qty 30, 30d supply, fill #1

## 2023-04-06 MED ORDER — MORPHINE SULFATE (PF) 4 MG/ML IV SOLN
4.0000 mg | Freq: Once | INTRAVENOUS | Status: AC
  Administered 2023-04-06: 4 mg via INTRAVENOUS
  Filled 2023-04-06: qty 1

## 2023-04-06 MED ORDER — INSULIN ASPART 100 UNIT/ML IJ SOLN
6.0000 [IU] | Freq: Once | INTRAMUSCULAR | Status: AC
  Administered 2023-04-06: 6 [IU] via INTRAVENOUS
  Filled 2023-04-06: qty 1

## 2023-04-06 MED ORDER — INSULIN ASPART 100 UNIT/ML FLEXPEN
PEN_INJECTOR | SUBCUTANEOUS | 5 refills | Status: DC
  Filled 2023-04-06: qty 15, 84d supply, fill #0

## 2023-04-06 NOTE — ED Provider Triage Note (Signed)
Emergency Medicine Provider Triage Evaluation Note  Yvonne Olson , a 43 y.o. female  was evaluated in triage.  Pt complains of epigastric pain, n/v x months. H/o cholecystectomy. Stopped taking diabetes and HTN meds 6 weeks ago  Review of Systems  Positive: Epigastric pain, n/v  Negative: fever  Physical Exam  BP (!) 184/108 (BP Location: Left Arm)   Pulse 90   Temp 98.8 F (37.1 C) (Oral)   Resp 16   Ht 5\' 11"  (1.803 m)   Wt 88.5 kg   LMP 11/17/2015 (Approximate)   SpO2 97%   BMI 27.20 kg/m  Gen:   Awake, no distress   Resp:  Normal effort  MSK:   Moves extremities without difficulty  Other:    Medical Decision Making  Medically screening exam initiated at 12:16 PM.  Appropriate orders placed.  Yvonne Olson was informed that the remainder of the evaluation will be completed by another provider, this initial triage assessment does not replace that evaluation, and the importance of remaining in the ED until their evaluation is complete.     Jackelyn Hoehn, PA-C 04/06/23 1217

## 2023-04-06 NOTE — ED Notes (Signed)
 DR. Azalee Bolds informed of pt's BG.

## 2023-04-06 NOTE — ED Provider Notes (Signed)
 [facility] Provider Note    Event Date/Time   First MD Initiated Contact with Patient 04/06/23 1619     (approximate)  History   Chief Complaint: Abdominal Pain  HPI  REVEL NAPP is a 43 y.o. female with a past medical history of diabetes, hypertension, presents to the emergency department with complaints of abdominal pain intermittent nausea and vomiting.  Patient states for the last 6 months or so she has been experiencing intermittent pains in the upper abdomen they have worsened over the last month or so.  Patient states frequent nausea.  Patient is diabetic and states she has been off of her insulin  for the last 5 to 6 weeks as she left [place]  and has not returned and is now living in this area.  Patient states she does not have a PCP in this area.  She also is out of her blood pressure medication.  Physical Exam   Triage Vital Signs: ED Triage Vitals  Encounter Vitals Group     BP 04/06/23 1212 (!) 184/108     Systolic BP Percentile --      Diastolic BP Percentile --      Pulse Rate 04/06/23 1212 90     Resp 04/06/23 1212 16     Temp 04/06/23 1212 98.8 F (37.1 C)     Temp Source 04/06/23 1212 Oral     SpO2 04/06/23 1212 97 %     Weight 04/06/23 1215 195 lb (88.5 kg)     Height 04/06/23 1215 5\' 11"  (1.803 m)     Head Circumference --      Peak Flow --      Pain Score 04/06/23 1215 8     Pain Loc --      Pain Education --      Exclude from Growth Chart --     Most recent vital signs: Vitals:   04/06/23 1212  BP: (!) 184/108  Pulse: 90  Resp: 16  Temp: 98.8 F (37.1 C)  SpO2: 97%    General: Awake, no distress.  CV:  Good peripheral perfusion.  Regular rate and rhythm  Resp:  Normal effort.  Equal breath sounds bilaterally.  Abd:  No distention.  Soft, mild epigastric tenderness.  Otherwise benign abdomen.  ED Results / Procedures / Treatments   EKG  EKG viewed and interpreted by myself shows a normal sinus rhythm 83  bpm with a narrow QRS, normal axis, normal intervals, no concerning ST changes.  MEDICATIONS ORDERED IN ED: Medications  insulin  aspart (novoLOG ) injection 6 Units (6 Units Intravenous Given 04/06/23 1642)  sodium chloride  0.9 % bolus 1,000 mL (1,000 mLs Intravenous New Bag/Given 04/06/23 1642)  sodium chloride  0.9 % bolus 1,000 mL (1,000 mLs Intravenous New Bag/Given 04/06/23 1640)  morphine  (PF) 4 MG/ML injection 4 mg (4 mg Intravenous Given 04/06/23 1641)  ondansetron  (ZOFRAN ) injection 4 mg (4 mg Intravenous Given 04/06/23 1641)     IMPRESSION / MDM / ASSESSMENT AND PLAN / ED COURSE  I reviewed the triage vital signs and the nursing notes.  Patient's presentation is most consistent with acute presentation with potential threat to life or bodily function.  Patient presents emergency department for 1 year of intermittent abdominal pain nausea vomiting which she states is worsened over the past 1 month after being off of her blood pressure and diabetic medications.  Patient states she was taking sliding scale insulin  previously but has been out of this medication for 5  to 6 weeks.  Was also on blood pressure medication but is also been out of this medication for 5 to 6 weeks.  Patient's lab work today in the emergency department does show hyperglycemia around 260 with a anion gap of 11.  LFTs and lipase are normal and the patient is status post cholecystectomy many years ago.  CBC is reassuring with a normal white blood cell count.  Patient's urinalysis shows no sign of infection but does show small amount of ketones and greater than 500 glucose.  Beta hydroxybutyric acid is slightly elevated as well and the patient's VBG shows a pH of 7.36.  Given the findings I have ordered 6 units of IV insulin  for the patient in addition to 2 L of normal saline.  We will also treat pain and nausea while in the emergency department.  As long as the patient is feeling better and the blood sugar decreases anticipate  the patient could be discharged home after significant IV hydration with new prescriptions for sliding scale insulin  blood glucose kit and blood pressure medication.  I will also refer to a primary care doctor.  Patient agreeable to plan of care.  Patient states she is for much better.  Blood glucose currently 157.  Patient has received 2 L of fluids as well as IV insulin .  I have called in the patient's prescriptions to the [facility] pharmacy.  Patient will pick up in the morning.  I have provided referral to primary care as well.  FINAL CLINICAL IMPRESSION(S) / ED DIAGNOSES   Hyperglycemia Nausea vomiting Abdominal pain Hypertension    Note:  This document was prepared using Dragon voice recognition software and may include unintentional dictation errors.   Ruth Cove, MD 04/06/23 [phone]

## 2023-04-06 NOTE — ED Triage Notes (Addendum)
 Patient c/o abdominal pain with N/V for months.  History gallbladder removal   Ambulatory with NAD  Patient reports she has not taken her insulin  for diabetes type 2 in 5-6 weeks and blood pressure medication in about a year

## 2023-04-07 ENCOUNTER — Other Ambulatory Visit: Payer: Self-pay

## 2023-04-07 MED ORDER — INSULIN PEN NEEDLE 32G X 4 MM MISC
0 refills | Status: AC
  Filled 2023-04-07: qty 100, 30d supply, fill #0

## 2023-04-13 ENCOUNTER — Other Ambulatory Visit: Payer: Self-pay

## 2023-04-13 NOTE — Progress Notes (Unsigned)
Celso Amy, PA-C [address]  [address]  [place], [place] [address]  Main: [phone]  Fax: [phone]   Gastroenterology Consultation  Referring Provider:     No ref. provider found Primary Care Physician:  Patient, No Pcp Per Primary Gastroenterologist:  Celso Amy, PA-C  Reason for Consultation:     RUQ Pain, Nausea, Vomiting, Enlarged Liver        HPI:   Yvonne Olson is a 43 y.o. y/o female referred for consultation & management  by Patient, No Pcp Per.    She went to [facility] health ED 11/2022 to evaluate RUQ pain ongoing for 6 months.  Current smoker for 29 years.  Takes pantoprazole 40 Mg daily and famotidine 20 Mg twice daily for GERD.  Zofran as needed nausea.  Has bloating and episodes of diarrhea alternating with constipation.  She has history of heavy alcohol use for many years.  Patient drank daily liquor heavily from age 19 to age 43.  Last year she decreased alcohol consumption.  She denies marijuana use.  She recently moved back to [place], [place] from [place].  She reports a 17 pound weight loss in the past 8 weeks.  She has a lot of abdominal bloating and swelling.  She reports severe RUQ pain with episodes of nausea and vomiting which comes and goes.  She denies heartburn or dysphagia.  No recent constipation.  Denies melena or hematochezia.  Patient admits to taking Goody powders, ibuprofen, and Tylenol which have not helped with her pain.  11/21/2022 RUQ ultrasound: Cholecystectomy, hepatic steatosis and hepatomegaly.  11/21/2022 CT abdomen pelvis with contrast: Hepatic steatosis, diverticulosis, right ovarian cyst.  Otherwise no acute abnormality.  Previous cholecystectomy, C-sections, and tubal ligation.  She has had previous Colonoscopy and EGD 07/2018 through [facility], digestive health specialist in [place].  History of chronic abdominal pain and diarrhea.  Long-term NSAID use.  EGD showed mild gastritis, otherwise normal.  Colonoscopy  showed 1 small 5 mm polyp removed and internal hemorrhoids.  5-year repeat will be due 07/2023.  04/06/2023 labs: Elevated glucose 260, low sodium 131, normal LFTs BUN and creatinine.  Pregnancy test negative.  Normal lipase.  Normal CBC with white count 7.0, hemoglobin 15.6.  Family history significant for 2 grandfathers, 2 uncles, and 1 cousin who had colon cancer.  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 Authorizing Provider  Accu-Chek Softclix Lancets lancets Use to check blood sugar in the morning, at noon, and at bedtime. 04/06/23  05/07/23  Minna Antis, MD  acetaminophen (TYLENOL) 325 MG tablet Take 650 mg by mouth every 6 (six) hours as needed.    [provider]  albuterol (VENTOLIN HFA) 108 (90 Base) MCG/ACT inhaler Inhale into the lungs. 03/04/21   [provider]  amLODipine (NORVASC) 5 MG tablet Take 1 tablet (5 mg total) by mouth daily. 04/06/23 04/05/24  Minna Antis, MD  aspirin EC 81 MG tablet Take 81 mg by mouth daily. Swallow whole.     [provider]  aspirin-acetaminophen-caffeine (EXCEDRIN MIGRAINE) [phone] MG tablet Take by mouth every 6 (six) hours as needed for headache.    [provider]  Blood Glucose Monitoring Suppl (BLOOD GLUCOSE MONITOR SYSTEM) w/Device KIT Use to check blood sugar in the morning, at noon, and at bedtime. 04/06/23   Minna Antis, MD  cloNIDine (CATAPRES) 0.1 MG tablet Take by mouth. 09/16/22   [provider]  cyclobenzaprine (FLEXERIL) 10 MG tablet Take 1 tablet (10 mg total) by mouth 2 (two) times daily as needed for muscle spasms. 09/09/20   Horton, Mayer Masker, MD  diclofenac (VOLTAREN) 75 MG EC tablet Take by mouth. 04/02/21   [provider]  famotidine (PEPCID) 20 MG tablet Take by mouth. 09/16/22   [provider]  fluconazole (DIFLUCAN) 150 MG tablet Take one tablet on day 1 and repeat with second tablet after 3-5 days if symptoms persist. 11/18/20   [provider]  Glucose Blood (BLOOD GLUCOSE TEST STRIPS) STRP 1 each by In Vitro route in the morning, at noon, and at bedtime. May substitute to any manufacturer covered by patient's insurance. 04/06/23 05/11/23  Minna Antis, MD  hydrochlorothiazide (HYDRODIURIL) 25 MG tablet Take 1 tablet (25 mg total) by mouth daily. 09/09/20   Horton, Mayer Masker, MD  ibuprofen (ADVIL) 600 MG tablet Take 1 tablet (600 mg total) by mouth every 6 (six) hours as needed. 09/09/20   Horton, Mayer Masker, MD  ibuprofen (ADVIL,MOTRIN) 800 MG tablet Take 1 tablet (800 mg total) by mouth 3 (three) times daily. 11/26/15   Domenick Gong, MD  insulin aspart (NOVOLOG) 100 UNIT/ML FlexPen Sliding scale insulin. 150-200: 1 unit SQ, 201-250: 2unit SQ, 251-300: 3 unit SQ, 301-350:4 unit SQ, >350: 6unit SQ 04/06/23   Minna Antis, MD  insulin aspart protamine- aspart (NOVOLOG MIX 70/30) (70-30) 100 UNIT/ML injection Inject 20-25 units into the skin 2 (two) times daily with meals. 03/30/21   [provider]   Insulin Pen Needle 32G X 4 MM MISC Use with Novolog 04/07/23   Minna Antis, MD  Lancet Device MISC 1 each by Does not apply route in the morning, at noon, and at bedtime. May substitute to any manufacturer covered by patient's insurance. 04/06/23 05/06/23  Minna Antis, MD  lisinopril (ZESTRIL) 10 MG tablet Take 1 tablet (10 mg total) by mouth daily. 09/09/20   Horton, Mayer Masker, MD  ondansetron (ZOFRAN-ODT) 4 MG disintegrating tablet Take by mouth. 11/21/22   [provider]  pantoprazole (PROTONIX) 40 MG tablet Take by mouth. 12/13/20   [provider]    Family History  Problem Relation Age of Onset   Cancer Other    Diabetes Other    CAD Other      Social History   Tobacco Use   Smoking status: Every Day    Types: Cigarettes   Smokeless tobacco: Never  Vaping Use   Vaping status: Never Used  Substance Use Topics   Alcohol use: Yes   Drug  use: No    Allergies as of 04/14/2023   (No Known Allergies)    Review of Systems:    All systems reviewed and negative except where noted in HPI.   Physical Exam:  BP (!) 160/94   Pulse 93   Temp 98 F (36.7 C)   Ht 5\' 10"  (1.778 m)   Wt 198 lb (89.8 kg)   LMP 11/17/2015 (Approximate)   BMI 28.41 kg/m  Patient's last menstrual period was 11/17/2015 (approximate).  General:   Alert,  Well-developed, well-nourished, pleasant and cooperative in NAD Lungs:  Respirations even and unlabored.  Clear throughout to auscultation.   No wheezes, crackles, or rhonchi. No acute distress. Heart:  Regular rate and rhythm; no murmurs, clicks, rubs, or gallops. Abdomen:  Normal bowel sounds.  No bruits.  Soft, and mildly obese without masses, or hernias noted.  Mild hepatomegaly noted.  There is tenderness over the right upper quadrant and epigastrium.  No lower abdominal tenderness.  No guarding or rebound tenderness.    Neurologic:  Alert and oriented x3;  grossly normal neurologically. Psych:  Alert and cooperative.  Normal mood and affect.  Imaging Studies: No results found.  Assessment and Plan:   BURNELL HARTL is a 43 y.o. y/o female has been referred for   1.  Chronic abdominal pain: Last abdominal CT and RUQ ultrasound 11/2022.  LFTs normal.  Previous cholecystectomy.  2.  Hepatic steatosis with hepatomegaly  Repeat RUQ ultrasound  Lab: NASH FibroSure, CBC, CMP, PT/INR.  Encourage patient to abstain from alcohol.  Recommend a low-fat diet, regular exercise, and weight loss. Patient education handout about fatty liver disease was given and discussed from up-to-date.   3.  Chronic nausea and Vomiting  Ordering Gastric emptying study to rule out gastroparesis given her uncontrolled diabetes.  Recommend avoid NSAIDs.  Consider repeat EGD if symptoms persist.  4.  History of colon polyp and Family History Colon Cancer in Multiple 2nd degree relatives   5-year repeat colonoscopy will be due 07/2023.  Follow up in 4 weeks with TG.  Celso Amy, PA-C

## 2023-04-14 ENCOUNTER — Ambulatory Visit (INDEPENDENT_AMBULATORY_CARE_PROVIDER_SITE_OTHER): Payer: Medicaid Other | Admitting: Physician Assistant

## 2023-04-14 ENCOUNTER — Encounter: Payer: Self-pay | Admitting: Physician Assistant

## 2023-04-14 VITALS — BP 160/94 | HR 93 | Temp 98.0°F | Ht 70.0 in | Wt 198.0 lb

## 2023-04-14 DIAGNOSIS — F1090 Alcohol use, unspecified, uncomplicated: Secondary | ICD-10-CM | POA: Diagnosis not present

## 2023-04-14 DIAGNOSIS — K760 Fatty (change of) liver, not elsewhere classified: Secondary | ICD-10-CM | POA: Diagnosis not present

## 2023-04-14 DIAGNOSIS — R112 Nausea with vomiting, unspecified: Secondary | ICD-10-CM | POA: Diagnosis not present

## 2023-04-14 DIAGNOSIS — R1011 Right upper quadrant pain: Secondary | ICD-10-CM | POA: Diagnosis not present

## 2023-04-14 DIAGNOSIS — R160 Hepatomegaly, not elsewhere classified: Secondary | ICD-10-CM

## 2023-04-14 DIAGNOSIS — Z800 Family history of malignant neoplasm of digestive organs: Secondary | ICD-10-CM

## 2023-04-14 DIAGNOSIS — R1900 Intra-abdominal and pelvic swelling, mass and lump, unspecified site: Secondary | ICD-10-CM

## 2023-04-14 DIAGNOSIS — Z860100 Personal history of colon polyps, unspecified: Secondary | ICD-10-CM

## 2023-04-14 NOTE — Patient Instructions (Signed)
Ultrasound scheduled 04/19/23 @ 7:45 arrival  @ [facility] entrance-nothing to eat / drink after midnight.   Gastric Emptying scheduled on 04/22/23 @ 9:15 am arrival @ A1/28/25 arrive at 7:45 @ [facility] entrance. Nothing to eat/drink after midnight.

## 2023-04-15 ENCOUNTER — Encounter: Payer: Self-pay | Admitting: Physician Assistant

## 2023-04-15 LAB — NASH FIBROSURE(R) PLUS

## 2023-04-16 LAB — CBC WITH DIFFERENTIAL/PLATELET
Basophils Absolute: 0 10*3/uL (ref 0.0–0.2)
Basos: 0 %
EOS (ABSOLUTE): 0 10*3/uL (ref 0.0–0.4)
Eos: 0 %
Hematocrit: 44.6 % (ref 34.0–46.6)
Hemoglobin: 14.4 g/dL (ref 11.1–15.9)
Immature Grans (Abs): 0 10*3/uL (ref 0.0–0.1)
Immature Granulocytes: 0 %
Lymphocytes Absolute: 1.8 10*3/uL (ref 0.7–3.1)
Lymphs: 23 %
MCH: 32.8 pg (ref 26.6–33.0)
MCHC: 32.3 g/dL (ref 31.5–35.7)
MCV: 102 fL — ABNORMAL HIGH (ref 79–97)
Monocytes Absolute: 0.4 10*3/uL (ref 0.1–0.9)
Monocytes: 6 %
Neutrophils Absolute: 5.3 10*3/uL (ref 1.4–7.0)
Neutrophils: 71 %
Platelets: 168 10*3/uL (ref 150–450)
RBC: 4.39 x10E6/uL (ref 3.77–5.28)
RDW: 12 % (ref 11.7–15.4)
WBC: 7.6 10*3/uL (ref 3.4–10.8)

## 2023-04-16 LAB — NASH FIBROSURE(R) PLUS
ALPHA 2-MACROGLOBULINS, QN: 138 mg/dL (ref 110–276)
ALT (SGPT) P5P: 33 IU/L (ref 0–40)
AST (SGOT) P5P: 19 IU/L (ref 0–40)
Apolipoprotein A-1: 144 mg/dL (ref 116–209)
Cholesterol, Total: 257 mg/dL — ABNORMAL HIGH (ref 100–199)
Fibrosis Score: 0.04 (ref 0.00–0.21)
GGT: 94 IU/L — ABNORMAL HIGH (ref 0–60)
Glucose: 261 mg/dL — ABNORMAL HIGH (ref 70–99)
Haptoglobin: 135 mg/dL (ref 42–296)
NASH Score: 0.21 (ref 0.00–0.25)
Steatosis Score: 0.95 — ABNORMAL HIGH (ref 0.00–0.40)
Triglycerides: 387 mg/dL — ABNORMAL HIGH (ref 0–149)

## 2023-04-16 LAB — COMPREHENSIVE METABOLIC PANEL
ALT: 28 [IU]/L (ref 0–32)
AST: 18 [IU]/L (ref 0–40)
Albumin: 4.2 g/dL (ref 3.9–4.9)
Alkaline Phosphatase: 97 [IU]/L (ref 44–121)
BUN/Creatinine Ratio: 17 (ref 9–23)
BUN: 14 mg/dL (ref 6–24)
Bilirubin Total: <0.2 mg/dL (ref 0.0–1.2)
CO2: 21 mmol/L (ref 20–29)
Calcium: 9.4 mg/dL (ref 8.7–10.2)
Chloride: 101 mmol/L (ref 96–106)
Creatinine, Ser: 0.81 mg/dL (ref 0.57–1.00)
Globulin, Total: 2.1 g/dL (ref 1.5–4.5)
Glucose: 260 mg/dL — ABNORMAL HIGH (ref 70–99)
Potassium: 5.1 mmol/L (ref 3.5–5.2)
Sodium: 136 mmol/L (ref 134–144)
Total Protein: 6.3 g/dL (ref 6.0–8.5)
eGFR: 92 mL/min/{1.73_m2} (ref >59)

## 2023-04-16 LAB — PROTIME-INR
INR: 0.9 (ref 0.9–1.2)
Prothrombin Time: 10.5 s (ref 9.1–12.0)

## 2023-04-19 ENCOUNTER — Ambulatory Visit: Admission: RE | Admit: 2023-04-19 | Payer: Medicaid Other | Source: Ambulatory Visit

## 2023-04-22 ENCOUNTER — Encounter: Payer: Medicaid Other | Attending: Physician Assistant

## 2023-05-12 ENCOUNTER — Other Ambulatory Visit: Payer: Self-pay

## 2023-05-18 ENCOUNTER — Ambulatory Visit
Admission: RE | Admit: 2023-05-18 | Discharge: 2023-05-18 | Disposition: A | Discharge status: Home / Self Care | Payer: Medicaid Other | Source: Ambulatory Visit | Attending: Physician Assistant | Admitting: Physician Assistant

## 2023-05-18 DIAGNOSIS — R1011 Right upper quadrant pain: Secondary | ICD-10-CM | POA: Insufficient documentation

## 2023-05-19 ENCOUNTER — Encounter: Payer: Self-pay | Admitting: Physician Assistant

## 2023-05-22 NOTE — Progress Notes (Deleted)
 Celso Amy, PA-C [address]  [address]  [place], [place] [address]  Main: [phone]  Fax: [phone]   Primary Care Physician: Pcp, No  Primary Gastroenterologist:  ***  CC:  HPI: FRANNY SELVAGE is a 43 y.o. female  Current Outpatient Medications  Medication Sig Dispense Refill   acetaminophen (TYLENOL) 325 MG tablet Take 650 mg by mouth every 6 (six) hours as needed. (Patient not taking: Reported on 04/14/2023)     albuterol (VENTOLIN HFA) 108 (90 Base) MCG/ACT inhaler Inhale into the lungs.     amLODipine (NORVASC) 5 MG tablet Take 1 tablet (5 mg total) by mouth daily. 30 tablet 5   aspirin EC 81 MG tablet Take 81 mg by mouth daily. Swallow whole. (Patient not taking: Reported on 04/14/2023)     aspirin-acetaminophen-caffeine (EXCEDRIN MIGRAINE) 250-250-65 MG tablet Take by mouth every 6 (six) hours as needed for headache. (Patient not taking: Reported on 04/14/2023)     Blood Glucose Monitoring Suppl (BLOOD GLUCOSE MONITOR SYSTEM) w/Device KIT Use to check blood sugar in the morning, at noon, and at bedtime. (Patient not taking: Reported on 04/14/2023) 1 kit 0   cloNIDine (CATAPRES) 0.1 MG tablet Take by mouth. (Patient not taking: Reported on 04/14/2023)     cyclobenzaprine (FLEXERIL) 10 MG tablet Take 1 tablet (10 mg total) by mouth 2 (two) times daily as needed for muscle spasms. (Patient not taking: Reported on 04/14/2023) 20 tablet 0   diclofenac (VOLTAREN) 75 MG EC tablet Take by mouth. (Patient not taking: Reported on 04/14/2023)     famotidine (PEPCID) 20 MG tablet Take by mouth. (Patient not taking: Reported on 04/14/2023)     fluconazole (DIFLUCAN) 150 MG tablet Take one tablet on day 1 and repeat with second tablet after 3-5 days if symptoms persist. (Patient not taking: Reported on 04/14/2023)     hydrochlorothiazide (HYDRODIURIL) 25 MG tablet Take 1 tablet (25 mg total) by mouth daily. (Patient not taking: Reported on 04/14/2023) 30 tablet 0   ibuprofen  (ADVIL) 600 MG tablet Take 1 tablet (600 mg total) by mouth every 6 (six) hours as needed. (Patient not taking: Reported on 04/14/2023) 30 tablet 0   ibuprofen (ADVIL,MOTRIN) 800 MG tablet Take 1 tablet (800 mg total) by mouth 3 (three) times daily. (Patient not taking: Reported on 04/14/2023) 30 tablet 0   insulin aspart (NOVOLOG) 100 UNIT/ML FlexPen Sliding scale insulin. 150-200: 1 unit SQ, 201-250: 2unit SQ, 251-300: 3 unit SQ, 301-350:4 unit SQ, >350: 6unit SQ 15 mL 5   insulin aspart protamine- aspart (NOVOLOG MIX 70/30) (70-30) 100 UNIT/ML injection Inject 20-25 units into the skin 2 (two) times daily with meals.     Insulin Pen Needle 32G X 4 MM MISC Use with Novolog (Patient not taking: Reported on 04/14/2023) 100 each 0   lisinopril (ZESTRIL) 10 MG tablet Take 1 tablet (10 mg total) by mouth daily. (Patient not taking: Reported on 04/14/2023) 30 tablet 0   ondansetron (ZOFRAN-ODT) 4 MG disintegrating tablet Take by mouth. (Patient not taking: Reported on 04/14/2023)     pantoprazole (PROTONIX) 40 MG tablet Take by mouth. (Patient not taking: Reported on 04/14/2023)     No current facility-administered medications for this visit.    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   Physical Examination:   LMP 11/17/2015 (Approximate)   General: Well-nourished, well-developed in no acute distress.  Lungs: Clear to auscultation bilaterally. Non-labored. Heart: Regular rate and rhythm, no murmurs rubs or gallops.  Abdomen: Bowel sounds are normal; Abdomen is Soft; No hepatosplenomegaly, masses or hernias;  No Abdominal Tenderness; No guarding or rebound tenderness. Neuro: Alert and oriented x 3.  Grossly intact.  Psych: Alert and cooperative, normal mood and affect.   Imaging Studies: US Abdomen Limited RUQ (LIVER/GB) Result Date: 05/19/2023 CLINICAL DATA:  Right upper quadrant pain EXAM: ULTRASOUND ABDOMEN LIMITED RIGHT UPPER QUADRANT COMPARISON:  CT abdomen 11/02/2009 FINDINGS: Gallbladder: Surgically absent Common bile duct: Diameter: 2.5 mm Liver: Increased echogenicity. No focal lesion. Portal vein is patent on color Doppler imaging with normal direction of blood flow towards the liver. Other: None. IMPRESSION: 1. Status post cholecystectomy. 2. Increased hepatic parenchymal echogenicity suggestive of steatosis. Electronically Signed   By: Annia Belt M.D.   On: 05/19/2023 16:16    Assessment and Plan:   ANNALEI FRIESZ is a 43 y.o. y/o female ***    Celso Amy, PA-C  Follow up ***  BP check ***

## 2023-05-23 ENCOUNTER — Ambulatory Visit: Payer: Medicaid Other | Admitting: Physician Assistant

## 2023-05-23 ENCOUNTER — Ambulatory Visit: Payer: Medicaid Other | Admitting: Family Medicine

## 2023-05-25 ENCOUNTER — Ambulatory Visit: Admitting: Family Medicine

## 2023-06-20 ENCOUNTER — Ambulatory Visit: Payer: Medicaid Other | Admitting: Physician Assistant

## 2023-08-09 ENCOUNTER — Ambulatory Visit: Admitting: Family Medicine

## 2023-10-05 ENCOUNTER — Encounter: Payer: Self-pay | Admitting: Family Medicine

## 2023-10-05 ENCOUNTER — Ambulatory Visit (INDEPENDENT_AMBULATORY_CARE_PROVIDER_SITE_OTHER): Admitting: Family Medicine

## 2023-10-05 ENCOUNTER — Other Ambulatory Visit: Payer: Self-pay

## 2023-10-05 VITALS — BP 178/119 | HR 103 | Temp 98.3°F | Resp 18 | Ht 70.0 in | Wt 199.0 lb

## 2023-10-05 DIAGNOSIS — J449 Chronic obstructive pulmonary disease, unspecified: Secondary | ICD-10-CM | POA: Diagnosis not present

## 2023-10-05 DIAGNOSIS — E1165 Type 2 diabetes mellitus with hyperglycemia: Secondary | ICD-10-CM | POA: Diagnosis not present

## 2023-10-05 DIAGNOSIS — I10 Essential (primary) hypertension: Secondary | ICD-10-CM | POA: Diagnosis not present

## 2023-10-05 DIAGNOSIS — R110 Nausea: Secondary | ICD-10-CM

## 2023-10-05 DIAGNOSIS — Z794 Long term (current) use of insulin: Secondary | ICD-10-CM

## 2023-10-05 DIAGNOSIS — K219 Gastro-esophageal reflux disease without esophagitis: Secondary | ICD-10-CM

## 2023-10-05 LAB — POCT GLYCOSYLATED HEMOGLOBIN (HGB A1C): Hemoglobin A1C: 8.4 % — AB (ref 4.0–5.6)

## 2023-10-05 MED ORDER — HYDROCHLOROTHIAZIDE 25 MG PO TABS: 25.0000 mg | ORAL_TABLET | Freq: Every day | ORAL | 0 refills | Status: DC

## 2023-10-05 MED ORDER — LANCET DEVICE MISC
1.0000 | Freq: Three times a day (TID) | 0 refills | Status: AC
  Filled 2023-10-05: qty 1, 30d supply, fill #0

## 2023-10-05 MED ORDER — BLOOD GLUCOSE TEST VI STRP
1.0000 | ORAL_STRIP | Freq: Three times a day (TID) | 0 refills | Status: AC
  Filled 2023-10-05: qty 100, 34d supply, fill #0

## 2023-10-05 MED ORDER — HYDROCHLOROTHIAZIDE 25 MG PO TABS
25.0000 mg | ORAL_TABLET | Freq: Every day | ORAL | 3 refills | Status: AC
  Filled 2023-10-05 – 2023-11-15 (×2): qty 90, 90d supply, fill #0

## 2023-10-05 MED ORDER — TRUEPLUS LANCETS 33G MISC
1.0000 | Freq: Three times a day (TID) | 0 refills | Status: AC
  Filled 2023-10-05: qty 100, 30d supply, fill #0

## 2023-10-05 MED ORDER — BLOOD GLUCOSE MONITOR SYSTEM W/DEVICE KIT
1.0000 | PACK | Freq: Three times a day (TID) | 0 refills | Status: DC
  Filled 2023-10-05: qty 1, 1d supply, fill #0

## 2023-10-05 MED ORDER — ALBUTEROL SULFATE HFA 108 (90 BASE) MCG/ACT IN AERS
1.0000 | INHALATION_SPRAY | Freq: Four times a day (QID) | RESPIRATORY_TRACT | 11 refills | Status: AC | PRN
  Filled 2023-10-05: qty 18, 30d supply, fill #0

## 2023-10-05 MED ORDER — ONDANSETRON HCL 4 MG PO TABS
4.0000 mg | ORAL_TABLET | Freq: Three times a day (TID) | ORAL | 1 refills | Status: DC | PRN
  Filled 2023-10-05: qty 30, 10d supply, fill #0

## 2023-10-05 MED ORDER — METFORMIN HCL 500 MG PO TABS
500.0000 mg | ORAL_TABLET | Freq: Two times a day (BID) | ORAL | 3 refills | Status: DC
  Filled 2023-10-05: qty 180, 90d supply, fill #0

## 2023-10-05 MED ORDER — PANTOPRAZOLE SODIUM 40 MG PO TBEC
40.0000 mg | DELAYED_RELEASE_TABLET | Freq: Every day | ORAL | 1 refills | Status: DC
Start: 2023-10-05 — End: 2023-11-04
  Filled 2023-10-05: qty 90, 90d supply, fill #0

## 2023-10-05 MED ORDER — AMLODIPINE BESYLATE 5 MG PO TABS
5.0000 mg | ORAL_TABLET | Freq: Every day | ORAL | 5 refills | Status: AC
  Filled 2023-10-05 – 2023-11-15 (×2): qty 30, 30d supply, fill #0

## 2023-10-05 NOTE — Assessment & Plan Note (Signed)
 She has been taking pantoprazole 

## 2023-10-05 NOTE — Progress Notes (Signed)
 New Patient Office Visit  Subjective    Patient ID: Yvonne Olson, female    DOB: Mar 17, 1981  Age: 43 y.o. MRN: [id number]  CC:  Chief Complaint  Patient presents with   Establish Care   Diabetes   Hypertension   Foot Pain    Right   Rash    Right foot    HPI Yvonne Olson presents to establish care Discussed the use of AI scribe software for clinical note transcription with the patient, who gave verbal consent to proceed.  History of Present Illness   Yvonne Olson is a 43 year old female with type 2 diabetes and hypertension who presents for management of her diabetes and blood pressure.  She has had type 2 diabetes for approximately 20 years, initially managed with metformin  and transitioned to insulin  therapy about 10 years ago. She previously used NovoLog  insulin  three times daily, but has not checked her blood sugar for the past two weeks due to lack of strips and loss of her glucometer.  Her hypertension has been untreated since February. Previous medications included clonidine 0.1 mg daily, amlodipine  5 mg, HCTZ 25 mg, and lisinopril  10 mg daily. She has experienced episodes of high blood pressure leading to emergency room visits, including one due to a fall. She experiences dizziness, particularly when standing, and a sensation of pressure in her throat.  She reports a history of liver findings on CT scans during ER visits in [place] and [place], with no follow-up. She experiences dizziness, balance issues, and a sensation of pressure in her throat, described as similar to a 'charley horse'.  For the past two years, she has had skin prblem on her left foot.  The rash is painful and  started small and have been growing. Various topical treatments have been ineffective. The lesions ache rather than itch or burn.  She has a history of acid reflux and uses over-the-counter medications like Rolaids, with no recent prescribed medication use.  She  underwent a partial hysterectomy five years ago due to heavy bleeding and fibroids, requiring blood transfusions prior to surgery. She has not had a Pap smear since the surgery.  She smokes less than a pack a day, reduced from two packs, and consumes alcohol rarely. She does not use drugs.  She has been traveling for work in [employer] for the past eight years and recently moved back home.     CT abdomen from ER 11/21/2022: Diffuse hepatic steatosis.  No focal liver lesion or biliary dilatation, gallbladder surgically absent.  Status postgastrectomy, right ovarian cyst measuring 3.4 cm  Outpatient Encounter Medications as of 10/05/2023  Medication Sig   acetaminophen  (TYLENOL ) 325 MG tablet Take 650 mg by mouth every 6 (six) hours as needed.   aspirin EC 81 MG tablet Take 81 mg by mouth daily. Swallow whole.   aspirin-acetaminophen -caffeine (EXCEDRIN MIGRAINE) 250-250-65 MG tablet Take by mouth every 6 (six) hours as needed for headache.   Blood Glucose Monitoring Suppl (BLOOD GLUCOSE MONITOR SYSTEM) w/Device KIT 1 each by Does not apply route in the morning, at noon, and at bedtime. May substitute to any manufacturer covered by patient's insurance.   cloNIDine (CATAPRES) 0.1 MG tablet Take by mouth.   cyclobenzaprine  (FLEXERIL ) 10 MG tablet Take 1 tablet (10 mg total) by mouth 2 (two) times daily as needed for muscle spasms.   diclofenac (VOLTAREN) 75 MG EC tablet Take by mouth.   famotidine (PEPCID) 20 MG tablet Take by mouth.  Glucose Blood (BLOOD GLUCOSE TEST STRIPS) STRP 1 each by In Vitro route in the morning, at noon, and at bedtime. May substitute to any manufacturer covered by patient's insurance.   hydrochlorothiazide  (HYDRODIURIL ) 25 MG tablet Take 1 tablet (25 mg total) by mouth daily.   ibuprofen  (ADVIL ) 600 MG tablet Take 1 tablet (600 mg total) by mouth every 6 (six) hours as needed.   ibuprofen  (ADVIL ,MOTRIN ) 800 MG tablet Take 1 tablet (800 mg total) by mouth 3 (three) times  daily.   insulin  aspart protamine- aspart (NOVOLOG  MIX 70/30) (70-30) 100 UNIT/ML injection Inject 20-25 units into the skin 2 (two) times daily with meals.   Insulin  Pen Needle 32G X 4 MM MISC Use with Novolog    Lancet Device MISC 1 each by Does not apply route in the morning, at noon, and at bedtime. May substitute to any manufacturer covered by patient's insurance.   lisinopril  (ZESTRIL ) 10 MG tablet Take 1 tablet (10 mg total) by mouth daily.   metFORMIN  (GLUCOPHAGE ) 500 MG tablet Take 1 tablet (500 mg total) by mouth 2 (two) times daily with a meal.   ondansetron  (ZOFRAN ) 4 MG tablet Take 1 tablet (4 mg total) by mouth every 8 (eight) hours as needed for nausea or vomiting.   ondansetron  (ZOFRAN -ODT) 4 MG disintegrating tablet Take by mouth.   pantoprazole  (PROTONIX ) 40 MG tablet Take by mouth.   TRUEplus Lancets 33G MISC 1 each by Does not apply route in the morning, at noon, and at bedtime. May substitute to any manufacturer covered by patient's insurance.   [DISCONTINUED] albuterol  (VENTOLIN  HFA) 108 (90 Base) MCG/ACT inhaler Inhale into the lungs.   [DISCONTINUED] amLODipine  (NORVASC ) 5 MG tablet Take 1 tablet (5 mg total) by mouth daily.   [DISCONTINUED] Blood Glucose Monitoring Suppl (BLOOD GLUCOSE MONITOR SYSTEM) w/Device KIT Use to check blood sugar in the morning, at noon, and at bedtime.   [DISCONTINUED] hydrochlorothiazide  (HYDRODIURIL ) 25 MG tablet Take 1 tablet (25 mg total) by mouth daily.   albuterol  (VENTOLIN  HFA) 108 (90 Base) MCG/ACT inhaler Inhale 1 puff into the lungs every 6 (six) hours as needed for wheezing or shortness of breath.   amLODipine  (NORVASC ) 5 MG tablet Take 1 tablet (5 mg total) by mouth daily.   fluconazole (DIFLUCAN) 150 MG tablet Take one tablet on day 1 and repeat with second tablet after 3-5 days if symptoms persist. (Patient not taking: Reported on 04/14/2023)   insulin  aspart (NOVOLOG ) 100 UNIT/ML FlexPen Sliding scale insulin . 150-200: 1 unit SQ,  201-250: 2unit SQ, 251-300: 3 unit SQ, 301-350:4 unit SQ, >350: 6unit SQ (Patient not taking: Reported on 10/05/2023)   [DISCONTINUED] hydrochlorothiazide  (HYDRODIURIL ) 25 MG tablet Take 1 tablet (25 mg total) by mouth daily.   No facility-administered encounter medications on file as of 10/05/2023.    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   Gastritis 06/19/2012   Hypertension    Iron deficiency anemia due to chronic blood loss 03/01/2018   Added automatically from request for surgery [id number]     Iron deficiency anemia, unspecified 02/14/2018   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   Thyroid  disease    TMJ syndrome 03/08/2013   Tobacco abuse 06/19/2012    Past Surgical History:  Procedure Laterality Date   CESAREAN SECTION     CHOLECYSTECTOMY     TUBAL LIGATION      Family History  Problem Relation Age of Onset   Cancer Other    Diabetes Other    CAD Other     Social History   Socioeconomic History   Marital status: Married    Spouse name: Not on file   Number of children: Not on file   Years of education: Not on file   Highest education level: Not on file  Occupational History   Not on file  Tobacco Use   Smoking status: Every Day    Types: Cigarettes    Passive exposure: Current   Smokeless tobacco: Never  Vaping Use   Vaping status: Never Used  Substance and Sexual Activity   Alcohol use: Yes   Drug use: No   Sexual activity: Yes  Other Topics Concern   Not on file  Social History Narrative   Not on file   Social Drivers of Health   Financial  Resource Strain: Medium Risk (12/02/2020)   Received from [facility]   Overall Financial Resource Strain (CARDIA)    Difficulty of Paying Living Expenses: Somewhat hard  Food Insecurity: No Food Insecurity (04/01/2021)   Received from [facility]   Hunger Vital Sign    Within the past 12 months, you worried that your food would run out before you got the money to buy more.: Never true    Within the past 12 months, the food you bought just didn't last and you didn't have money to get more.: Never true  Transportation Needs: Unmet Transportation Needs (12/02/2020)   Received [facility] - [facility]    Lack of Transportation (Medical): Yes    Lack of Transportation (Non-Medical): Yes  Physical Activity: Insufficiently Active (12/02/2020)   Received from [facility]   Exercise Vital Sign    On average, how many days per week do you engage in moderate to strenuous exercise (like a brisk walk)?: 4 days    On average, how many minutes do you engage in exercise at this level?: 30 min  Stress: Stress Concern Present (12/02/2020)   Received from [facility] - Occupational Stress Questionnaire    Feeling of Stress : Very much  Social Connections: Unknown (07/19/2021)   [facility] from [facility]   Social Network    Social Network: Not on file  Intimate Partner Violence: Not At Risk (11/21/2022)   Received from [facility]   HITS    Over the last 12 months how often did your partner physically hurt you?: Never    Over the last 12 months how often did your partner insult you or talk down to you?: Never    Over the last 12 months how often did your partner threaten you with physical harm?: Never    Over the last 12 months how often did your partner scream or curse at you?: Never    ROS      Objective   BP (!) 178/119 (BP Location: Left Arm, Patient Position: Sitting, Cuff Size: Normal)   Pulse (!) 103   Temp 98.3 F  (36.8 C) (Oral)   Resp 18   Ht 5' 10 (1.778 m)   Wt 199 lb (90.3  kg)   LMP 11/17/2015 (Approximate)   SpO2 95%   BMI 28.55 kg/m    Physical Exam Vitals and nursing note reviewed.  Constitutional:      Appearance: Normal appearance.  HENT:     Head: Normocephalic and atraumatic.  Eyes:     Conjunctiva/sclera: Conjunctivae normal.  Cardiovascular:     Rate and Rhythm: Normal rate and regular rhythm.  Pulmonary:     Effort: Pulmonary effort is normal.     Breath sounds: Normal breath sounds.  Musculoskeletal:     Right lower leg: No edema.     Left lower leg: No edema.  Skin:    General: Skin is warm and dry.  Neurological:     Mental Status: She is alert and oriented to person, place, and time.  Psychiatric:        Mood and Affect: Mood normal.        Behavior: Behavior normal.        Thought Content: Thought content normal.        Judgment: Judgment normal.            The ASCVD Risk score (Arnett DK, et al., 2019) failed to calculate for the following reasons:   Cannot find a previous HDL lab     Assessment & Plan:  Primary hypertension Assessment & Plan: She was taking clonidine 0.1 mg daily, amlodipine  5 mg, HCTZ 25 mg and lisinopril  10 mg daily.  She has not had blood pressure medicine in some time now plan to restart amlodipine  5 mg and HCTZ 25 mg and then see her back monthly and we will add on her other blood pressure medicines  Orders: -     amLODIPine  Besylate; Take 1 tablet (5 mg total) by mouth daily.  Dispense: 30 tablet; Refill: 5 -     CBC with Differential/Platelet -     Comprehensive metabolic panel with GFR -     Lipid panel -     TSH + free T4 -     hydroCHLOROthiazide ; Take 1 tablet (25 mg total) by mouth daily.  Dispense: 90 tablet; Refill: 3  Type 2 diabetes mellitus with hyperglycemia, with long-term current use of insulin  (HCC) Assessment & Plan: She was taking short acting insulin  3 times a day but was not on an insulin  glargine.   She also stopped metformin  when she started the insulin .  Orders: -     POCT glycosylated hemoglobin (Hb A1C) -     metFORMIN  HCl; Take 1 tablet (500 mg total) by mouth 2 (two) times daily with a meal.  Dispense: 180 tablet; Refill: 3 -     Blood Glucose Monitor System; 1 each by Does not apply route in the morning, at noon, and at bedtime. May substitute to any manufacturer covered by patient's insurance.  Dispense: 1 kit; Refill: 0 -     Blood Glucose Test; 1 each by In Vitro route in the morning, at noon, and at bedtime. May substitute to any manufacturer covered by patient's insurance.  Dispense: 100 strip; Refill: 0 -     Lancet Device; 1 each by Does not apply route in the morning, at noon, and at bedtime. May substitute to any manufacturer covered by patient's insurance.  Dispense: 1 each; Refill: 0 -     TRUEplus Lancets 33G; 1 each by Does not apply route in the morning, at noon, and at bedtime. May substitute to any manufacturer covered by patient's insurance.  Dispense: 100 each;  Refill: 0  Nausea -     Ondansetron  HCl; Take 1 tablet (4 mg total) by mouth every 8 (eight) hours as needed for nausea or vomiting.  Dispense: 30 tablet; Refill: 1  COPD mixed type (HCC) -     Albuterol  Sulfate HFA; Inhale 1 puff into the lungs every 6 (six) hours as needed for wheezing or shortness of breath.  Dispense: 18 g; Refill: 11  Assessment and Plan    Type 2 Diabetes Mellitus Long-standing type 2 diabetes mellitus, previously managed with metformin  and insulin . Currently not on any diabetes medication and has not checked blood glucose in two weeks due to lack of supplies. A1c 8.4% today.   - Prescribe metformin  500 mg to be taken with the evening meal for 2 weeks and then increase to twice daily with meals. - Provide a glucometer for home blood glucose monitoring  Hypertension Hypertension, previously managed with clonidine, amlodipine , HCTZ, and lisinopril . Currently not on any antihypertensive  medication and has experienced hypertensive episodes leading to ER visits. Plan to restart antihypertensive medications gradually, beginning with amlodipine  5 mg and HCTZ 25 mg, and monitor blood pressure closely. - Restart amlodipine  5 mg daily - Restart HCTZ 25 mg daily - Schedule monthly follow-up to monitor blood pressure  Dizziness and Falls Recurrent dizziness and falls, often occurring upon standing, suggestive of possible orthostatic hypotension or other balance issues. Symptoms have been worsening, affecting daily activities.  Skin Lesions Chronic skin lesions on dorsum of right foot with aching pain, possibly fungal in nature. Lesions have been spreading and are unresponsive to over-the-counter treatments.  - Prescribe nystatin for topical application - Consider dermatology referral if no improvement with treatment  Gastroesophageal Reflux Disease (GERD) GERD, currently managed with over-the-counter medications. No prescription medication currently being taken for acid reflux.  General Health Maintenance No recent Pap smear since partial hysterectomy five years ago. History of smoking with a reduction in usage from 2 packs/day to less than a pack a day.    Mischa Brittingham K Janya Eveland, MD

## 2023-10-05 NOTE — Assessment & Plan Note (Signed)
 She was taking short acting insulin  3 times a day but was not on an insulin  glargine.  She also stopped metformin  when she started the insulin .

## 2023-10-05 NOTE — Assessment & Plan Note (Signed)
 She was taking clonidine 0.1 mg daily, amlodipine  5 mg, HCTZ 25 mg and lisinopril  10 mg daily.  She has not had blood pressure medicine in some time now plan to restart amlodipine  5 mg and HCTZ 25 mg and then see her back monthly and we will add on her other blood pressure medicines

## 2023-10-06 ENCOUNTER — Ambulatory Visit: Payer: Self-pay | Admitting: Family Medicine

## 2023-10-06 LAB — LIPID PANEL
Chol/HDL Ratio: 9.9 ratio — ABNORMAL HIGH (ref 0.0–4.4)
Cholesterol, Total: 345 mg/dL — ABNORMAL HIGH (ref 100–199)
HDL: 35 mg/dL — ABNORMAL LOW (ref >39)
Triglycerides: 1126 mg/dL (ref 0–149)

## 2023-10-06 LAB — TSH+FREE T4
Free T4: 1.04 ng/dL (ref 0.82–1.77)
TSH: 1.49 u[IU]/mL (ref 0.450–4.500)

## 2023-10-06 LAB — CBC WITH DIFFERENTIAL/PLATELET
Basophils Absolute: 0 x10E3/uL (ref 0.0–0.2)
Basos: 1 %
EOS (ABSOLUTE): 0.1 x10E3/uL (ref 0.0–0.4)
Eos: 1 %
Hematocrit: 44.9 % (ref 34.0–46.6)
Hemoglobin: 14.5 g/dL (ref 11.1–15.9)
Immature Grans (Abs): 0 x10E3/uL (ref 0.0–0.1)
Immature Granulocytes: 0 %
Lymphocytes Absolute: 1.4 x10E3/uL (ref 0.7–3.1)
Lymphs: 23 %
MCH: 32.8 pg (ref 26.6–33.0)
MCHC: 32.3 g/dL (ref 31.5–35.7)
MCV: 102 fL — ABNORMAL HIGH (ref 79–97)
Monocytes Absolute: 0.4 x10E3/uL (ref 0.1–0.9)
Monocytes: 6 %
Neutrophils Absolute: 4.4 x10E3/uL (ref 1.4–7.0)
Neutrophils: 68 %
Platelets: 174 x10E3/uL (ref 150–450)
RBC: 4.42 x10E6/uL (ref 3.77–5.28)
RDW: 12.6 % (ref 11.7–15.4)
WBC: 6.3 x10E3/uL (ref 3.4–10.8)

## 2023-10-06 LAB — COMPREHENSIVE METABOLIC PANEL WITH GFR
ALT: 88 IU/L — ABNORMAL HIGH (ref 0–32)
AST: 68 IU/L — ABNORMAL HIGH (ref 0–40)
Albumin: 4.9 g/dL (ref 3.9–4.9)
Alkaline Phosphatase: 94 IU/L (ref 44–121)
BUN/Creatinine Ratio: 14 (ref 9–23)
BUN: 10 mg/dL (ref 6–24)
Bilirubin Total: <0.2 mg/dL (ref 0.0–1.2)
CO2: 18 mmol/L — ABNORMAL LOW (ref 20–29)
Calcium: 10.3 mg/dL — ABNORMAL HIGH (ref 8.7–10.2)
Chloride: 96 mmol/L (ref 96–106)
Creatinine, Ser: 0.74 mg/dL (ref 0.57–1.00)
Globulin, Total: 2.7 g/dL (ref 1.5–4.5)
Glucose: 237 mg/dL — ABNORMAL HIGH (ref 70–99)
Potassium: 4.5 mmol/L (ref 3.5–5.2)
Sodium: 134 mmol/L (ref 134–144)
Total Protein: 7.6 g/dL (ref 6.0–8.5)
eGFR: 103 mL/min/1.73 (ref >59)

## 2023-10-17 ENCOUNTER — Other Ambulatory Visit: Payer: Self-pay

## 2023-10-31 ENCOUNTER — Other Ambulatory Visit: Payer: Self-pay

## 2023-10-31 ENCOUNTER — Emergency Department

## 2023-10-31 ENCOUNTER — Emergency Department
Admission: EM | Admit: 2023-10-31 | Discharge: 2023-10-31 | Disposition: A | Discharge status: Home / Self Care | Attending: Emergency Medicine | Admitting: Emergency Medicine

## 2023-10-31 DIAGNOSIS — D696 Thrombocytopenia, unspecified: Secondary | ICD-10-CM

## 2023-10-31 DIAGNOSIS — E119 Type 2 diabetes mellitus without complications: Secondary | ICD-10-CM | POA: Diagnosis not present

## 2023-10-31 DIAGNOSIS — J0110 Acute frontal sinusitis, unspecified: Secondary | ICD-10-CM | POA: Diagnosis not present

## 2023-10-31 DIAGNOSIS — M542 Cervicalgia: Secondary | ICD-10-CM | POA: Insufficient documentation

## 2023-10-31 DIAGNOSIS — L03211 Cellulitis of face: Secondary | ICD-10-CM | POA: Diagnosis not present

## 2023-10-31 DIAGNOSIS — K047 Periapical abscess without sinus: Secondary | ICD-10-CM | POA: Diagnosis not present

## 2023-10-31 DIAGNOSIS — F17200 Nicotine dependence, unspecified, uncomplicated: Secondary | ICD-10-CM | POA: Diagnosis not present

## 2023-10-31 DIAGNOSIS — R519 Headache, unspecified: Secondary | ICD-10-CM | POA: Diagnosis present

## 2023-10-31 DIAGNOSIS — R059 Cough, unspecified: Secondary | ICD-10-CM | POA: Insufficient documentation

## 2023-10-31 DIAGNOSIS — I10 Essential (primary) hypertension: Secondary | ICD-10-CM | POA: Diagnosis not present

## 2023-10-31 LAB — CBC WITH DIFFERENTIAL/PLATELET
Abs Immature Granulocytes: 0.03 K/uL (ref 0.00–0.07)
Basophils Absolute: 0.1 K/uL (ref 0.0–0.1)
Basophils Relative: 1 %
Eosinophils Absolute: 0.1 K/uL (ref 0.0–0.5)
Eosinophils Relative: 1 %
HCT: 42.6 % (ref 36.0–46.0)
Hemoglobin: 14.9 g/dL (ref 12.0–15.0)
Immature Granulocytes: 0 %
Lymphocytes Relative: 16 %
Lymphs Abs: 1.4 K/uL (ref 0.7–4.0)
MCH: 33.8 pg (ref 26.0–34.0)
MCHC: 35 g/dL (ref 30.0–36.0)
MCV: 96.6 fL (ref 80.0–100.0)
Monocytes Absolute: 0.8 K/uL (ref 0.1–1.0)
Monocytes Relative: 10 %
Neutro Abs: 6.3 K/uL (ref 1.7–7.7)
Neutrophils Relative %: 72 %
Platelets: 120 K/uL — ABNORMAL LOW (ref 150–400)
RBC: 4.41 MIL/uL (ref 3.87–5.11)
RDW: 12 % (ref 11.5–15.5)
WBC: 8.6 K/uL (ref 4.0–10.5)
nRBC: 0 % (ref 0.0–0.2)

## 2023-10-31 LAB — BASIC METABOLIC PANEL WITH GFR
Anion gap: 11 (ref 5–15)
BUN: 15 mg/dL (ref 6–20)
CO2: 21 mmol/L — ABNORMAL LOW (ref 22–32)
Calcium: 9.3 mg/dL (ref 8.9–10.3)
Chloride: 98 mmol/L (ref 98–111)
Creatinine, Ser: 0.7 mg/dL (ref 0.44–1.00)
GFR, Estimated: >60 mL/min (ref >60)
Glucose, Bld: 324 mg/dL — ABNORMAL HIGH (ref 70–99)
Potassium: 4.2 mmol/L (ref 3.5–5.1)
Sodium: 130 mmol/L — ABNORMAL LOW (ref 135–145)

## 2023-10-31 LAB — CBG MONITORING, ED
Glucose-Capillary: 336 mg/dL — ABNORMAL HIGH (ref 70–99)
Glucose-Capillary: 314 mg/dL — ABNORMAL HIGH (ref 70–99)

## 2023-10-31 LAB — GROUP A STREP BY PCR: Group A Strep by PCR: NOT DETECTED

## 2023-10-31 MED ORDER — OXYCODONE-ACETAMINOPHEN 5-325 MG PO TABS
1.0000 | ORAL_TABLET | ORAL | 0 refills | Status: DC | PRN
  Filled 2023-10-31: qty 10, 2d supply, fill #0

## 2023-10-31 MED ORDER — IOHEXOL 300 MG/ML  SOLN
75.0000 mL | Freq: Once | INTRAMUSCULAR | Status: AC | PRN
  Administered 2023-10-31 (×2): 75 mL via INTRAVENOUS

## 2023-10-31 MED ORDER — AMLODIPINE BESYLATE 5 MG PO TABS
5.0000 mg | ORAL_TABLET | Freq: Once | ORAL | Status: AC
  Administered 2023-10-31 (×2): 5 mg via ORAL
  Filled 2023-10-31: qty 1

## 2023-10-31 MED ORDER — AMOXICILLIN-POT CLAVULANATE 875-125 MG PO TABS
1.0000 | ORAL_TABLET | Freq: Once | ORAL | Status: AC
  Administered 2023-10-31 (×2): 1 via ORAL
  Filled 2023-10-31: qty 1

## 2023-10-31 MED ORDER — ONDANSETRON 4 MG PO TBDP
4.0000 mg | ORAL_TABLET | Freq: Three times a day (TID) | ORAL | 0 refills | Status: AC | PRN
  Filled 2023-10-31: qty 30, 10d supply, fill #0

## 2023-10-31 MED ORDER — AMOXICILLIN-POT CLAVULANATE 875-125 MG PO TABS
1.0000 | ORAL_TABLET | Freq: Two times a day (BID) | ORAL | 0 refills | Status: DC
  Filled 2023-10-31: qty 14, 7d supply, fill #0

## 2023-10-31 MED ORDER — HYDROCHLOROTHIAZIDE 25 MG PO TABS
25.0000 mg | ORAL_TABLET | Freq: Once | ORAL | Status: AC
  Administered 2023-10-31 (×2): 25 mg via ORAL
  Filled 2023-10-31: qty 1

## 2023-10-31 MED ORDER — SODIUM CHLORIDE 0.9 % IV BOLUS
500.0000 mL | Freq: Once | INTRAVENOUS | Status: AC
  Administered 2023-10-31 (×2): 500 mL via INTRAVENOUS

## 2023-10-31 MED ORDER — INSULIN ASPART 100 UNIT/ML IJ SOLN
5.0000 [IU] | Freq: Once | INTRAMUSCULAR | Status: AC
  Administered 2023-10-31 (×2): 5 [IU] via SUBCUTANEOUS
  Filled 2023-10-31: qty 1

## 2023-10-31 MED ORDER — KETOROLAC TROMETHAMINE 15 MG/ML IJ SOLN
15.0000 mg | Freq: Once | INTRAMUSCULAR | Status: AC
  Administered 2023-10-31 (×2): 15 mg via INTRAVENOUS
  Filled 2023-10-31: qty 1

## 2023-10-31 MED ORDER — OXYCODONE-ACETAMINOPHEN 5-325 MG PO TABS
1.0000 | ORAL_TABLET | Freq: Once | ORAL | Status: AC
  Administered 2023-10-31 (×2): 1 via ORAL
  Filled 2023-10-31: qty 1

## 2023-10-31 MED ORDER — MORPHINE SULFATE (PF) 4 MG/ML IV SOLN
4.0000 mg | Freq: Once | INTRAVENOUS | Status: AC
  Administered 2023-10-31 (×2): 4 mg via INTRAVENOUS
  Filled 2023-10-31: qty 1

## 2023-10-31 NOTE — Discharge Instructions (Addendum)
 You were seen in the emergency department for swelling and pain to the left side of your face and neck.  You had a CT scan that showed signs of cellulitis to the left face, sinusitis and a questionable small abscess to your left lower tooth.  You are given IV fluids and pain medication in the emergency department.  You are given your first dose of oral antibiotics.  Your glucose was elevated in the emergency department.  It did improve with insulin .  It is important that you pick up and take your diabetic medication and your antihypertensive medications as prescribed.  You are started on an antibiotic, take as prescribed and follow-up closely with your primary care physician for reevaluation.  Return to the emergency department for any worsening symptoms.  You were given a list of low-cost dentists in the area.  Your platelets were mildly low today, follow-up with your primary care physician so they can recheck your lab work.  Pain control:  Ibuprofen  (motrin /aleve/advil ) - You can take 3 tablets (600 mg) every 6 hours as needed for pain/fever.  Acetaminophen  (tylenol ) - You can take 2 extra strength tablets (1000 mg) every 6 hours as needed for pain/fever.  You can alternate these medications or take them together.  Make sure you eat food/drink water when taking these medications.  You were given a prescription for narcotic pain medications.  Take only if in severe pain.  These are very addictive medications.  These medications can make you constipated.  If you need to take more than 1-2 doses, start a stool softner.  If you become constipated, take 1 capfull of MiraLAX, can repeat untill having regular bowel movements.  Keep this medication out of reach of any children. You cannot drive/work while taking this medication.

## 2023-10-31 NOTE — ED Provider Notes (Signed)
 [facility] Provider Note    Event Date/Time   First MD Initiated Contact with Patient 10/31/23 [phone]     (approximate)   History   Neck Pain   HPI  Yvonne Olson is a 43 y.o. female past medical history significant for hypertension, diabetes, tobacco use, who presents to the emergency department with facial pain and neck pain.  Saturday morning woke up with some mild pain to the left side of her face and neck.  Progressively worsening since that time with swelling and warmth.  Endorses a fever prior to arrival.  Denies feeling like there is an infection to any of her teeth though she has not seen a dentist for quite some time.  Does have some mild change in voice.  No trouble swallowing.  Taking Excedrin for pain control with minimal improvement.  Also endorses a worsening cough over the past couple of weeks.  Does endorse tobacco use and states she is attempting to cut back smoking.  Recently saw her primary care physician and was ordered her antihypertensive medications, states she has not yet filled these and that she needs to pick them up.  Denies any significant chest pain or shortness of breath.  No known allergies.     Physical Exam   Triage Vital Signs: ED Triage Vitals  Encounter Vitals Group     BP 10/31/23 0834 (!) 196/136     Girls Systolic BP Percentile --      Girls Diastolic BP Percentile --      Boys Systolic BP Percentile --      Boys Diastolic BP Percentile --      Pulse Rate 10/31/23 0834 (!) 109     Resp 10/31/23 0834 18     Temp 10/31/23 0834 98.5 F (36.9 C)     Temp src --      SpO2 10/31/23 0834 100 %     Weight 10/31/23 0833 200 lb (90.7 kg)     Height 10/31/23 0833 5' 11 (1.803 m)     Head Circumference --      Peak Flow --      Pain Score 10/31/23 0833 8     Pain Loc --      Pain Education --      Exclude from Growth Chart --     Most recent vital signs: Vitals:   10/31/23 1145 10/31/23 1157  BP:  (!) 143/81   Pulse: 94 94  Resp: 18 18  Temp: 98.1 F (36.7 C) 98.1 F (36.7 C)  SpO2: 100% 99%    Physical Exam Constitutional:      Appearance: She is well-developed.  HENT:     Right Ear: Tympanic membrane and external ear normal.     Left Ear: Tympanic membrane and external ear normal.     Mouth/Throat:     Comments: No obvious periapical abscess.  No fullness under the tongue.  No trismus.  Uvula is midline.  No exudate.  No erythema to the posterior oropharyngeal Eyes:     Extraocular Movements: Extraocular movements intact.     Conjunctiva/sclera: Conjunctivae normal.     Pupils: Pupils are equal, round, and reactive to light.  Neck:     Comments: Erythema and warmth to the left lateral neck.  Appears to be somewhat swollen.  Cervical lymphadenopathy.  No palpable abscess noted.  Some tenderness to palpation to the left frontal sinuses. Cardiovascular:     Rate and Rhythm: Regular rhythm.  Tachycardia present.  Pulmonary:     Effort: No respiratory distress.  Abdominal:     General: There is no distension.  Musculoskeletal:        General: Normal range of motion.     Cervical back: Normal range of motion. Tenderness present.  Lymphadenopathy:     Cervical: Cervical adenopathy present.  Skin:    General: Skin is warm.     Capillary Refill: Capillary refill takes less than 2 seconds.  Neurological:     Mental Status: She is alert. Mental status is at baseline.  Psychiatric:        Mood and Affect: Mood normal.      IMPRESSION / MDM / ASSESSMENT AND PLAN / ED COURSE  I reviewed the triage vital signs and the nursing notes.  Differential diagnosis including cellulitis, abscess, deep space facial infection, dental abscess, strep pharyngitis.  No significant chest pain or shortness of breath, have a low suspicion for pulmonary embolism ACS or anemia as the cause of her neck pain and sore throat.  Significantly hypertensive on arrival with a blood pressure of 196/136.  Also  tachycardic but afebrile.  100% on room air.  Hypertension likely in the setting of pain and also not taking her daily hypertensive medications.  On chart review patient was just switched from antihypertensive medication she was not taking to amlodipine  and HCTZ.  Will give first dose of these medications while she is in the emergency department and encouraged her to go pick up her prescriptions when leaving the ER today   Will give IV fluids, pain medication, patient states she can find a ride home.   RADIOLOGY CT IV contrast soft tissue neck -CT scan showed mild subcutaneous edema to the mandible on the left but no obvious abscess.  Periapical lucency of the left second mandibular molar.  Circumferential mucosal disease to the floor of the left maxillary sinus.  Chest x-ray with no acute findings.   Labs (all labs ordered are listed, but only abnormal results are displayed) Labs interpreted as -    Labs Reviewed  CBC WITH DIFFERENTIAL/PLATELET - Abnormal; Notable for the following components:      Result Value   Platelets 120 (*)    All other components within normal limits  BASIC METABOLIC PANEL WITH GFR - Abnormal; Notable for the following components:   Sodium 130 (*)    CO2 21 (*)    Glucose, Bld 324 (*)    All other components within normal limits  CBG MONITORING, ED - Abnormal; Notable for the following components:   Glucose-Capillary 336 (*)    All other components within normal limits  CBG MONITORING, ED - Abnormal; Notable for the following components:   Glucose-Capillary 314 (*)    All other components within normal limits  GROUP A STREP BY PCR    Patient with uncontrolled hypertension.  Restarted her on her home antihypertensive medications.  Patient has a prescription for antihypertensive medications which she states that she will pick up but has multiple refills on it.  Do not feel that she needs a new antihypertensive medications.  Given first dose in the  emergency department.  Strep testing negative.  No significant leukocytosis.  No significant anemia.  Platelets are low at 120 -platelets have been low as 149 in the past.  Creatinine appears to be at her baseline.  No significant electrolyte abnormalities.  Corrected sodium within normal limits.  Glucose 324 but does not meet criteria for DKA with  a normal anion gap of 11.  Given a 500 bolus and will recheck her glucose. Clinical Course as of 10/31/23 1341  Mon Oct 31, 2023  1227 Patient states she is feeling much better with some ongoing pain.  Glucose elevated in the 300s so given IV fluids and insulin .  Given a dose of IV morphine  for pain control. [SM]    Clinical Course User Index [SM] Suzanne Kirsch, MD   Patient feeling better.  Given first dose of oral antibiotics while in the emergency department and able to tolerate p.o.  Will start the patient on a course of antibiotics with Augmentin .  Discussed close follow-up with primary care physician in the next 2 to 3 days for reevaluation and return to the emergency department for any worsening symptoms.  Discussed smoking cessation.  Given a list of dentist to follow-up within the area.  Discussed her low platelets.  Discussed return precautions for any worsening symptoms.  No questions at time of discharge.  PROCEDURES:  Critical Care performed: No  Procedures  Patient's presentation is most consistent with acute presentation with potential threat to life or bodily function.   MEDICATIONS ORDERED IN ED: Medications  sodium chloride  0.9 % bolus 500 mL (0 mLs Intravenous Stopped 10/31/23 0943)  oxyCODONE -acetaminophen  (PERCOCET/ROXICET) 5-325 MG per tablet 1 tablet (1 tablet Oral Given 10/31/23 0902)  ketorolac  (TORADOL ) 15 MG/ML injection 15 mg (15 mg Intravenous Given 10/31/23 0903)  amLODipine  (NORVASC ) tablet 5 mg (5 mg Oral Given 10/31/23 0903)  hydrochlorothiazide  (HYDRODIURIL ) tablet 25 mg (25 mg Oral Given 10/31/23 0943)  iohexol   (OMNIPAQUE ) 300 MG/ML solution 75 mL (75 mLs Intravenous Contrast Given 10/31/23 1116)  sodium chloride  0.9 % bolus 500 mL (0 mLs Intravenous Stopped 10/31/23 1250)  insulin  aspart (novoLOG ) injection 5 Units (5 Units Subcutaneous Given 10/31/23 1204)  morphine  (PF) 4 MG/ML injection 4 mg (4 mg Intravenous Given 10/31/23 1215)  amoxicillin -clavulanate (AUGMENTIN ) 875-125 MG per tablet 1 tablet (1 tablet Oral Given 10/31/23 1309)    FINAL CLINICAL IMPRESSION(S) / ED DIAGNOSES   Final diagnoses:  Uncontrolled hypertension  Thrombocytopenia (HCC)  Dental abscess  Acute non-recurrent frontal sinusitis  Cellulitis of face     Rx / DC Orders   ED Discharge Orders          Ordered    amoxicillin -clavulanate (AUGMENTIN ) 875-125 MG tablet  2 times daily        10/31/23 1337    ondansetron  (ZOFRAN -ODT) 4 MG disintegrating tablet  Every 8 hours PRN        10/31/23 1337    oxyCODONE -acetaminophen  (PERCOCET) 5-325 MG tablet  Every 4 hours PRN        10/31/23 1337             Note:  This document was prepared using Dragon voice recognition software and may include unintentional dictation errors.   Suzanne Kirsch, MD 10/31/23 1341

## 2023-10-31 NOTE — ED Triage Notes (Signed)
 Pt comes with sore throat, neck pain and jaw swelling. Pt states this all started Saturday morning. Pt states no relief with pain meds. Pt states no dental pain but gum pain.

## 2023-11-02 ENCOUNTER — Ambulatory Visit: Payer: Self-pay

## 2023-11-02 ENCOUNTER — Inpatient Hospital Stay
Admission: EM | Admit: 2023-11-02 | Discharge: 2023-11-04 | DRG: 638 | Disposition: A | Discharge status: Home / Self Care | Attending: Obstetrics and Gynecology | Admitting: Obstetrics and Gynecology

## 2023-11-02 ENCOUNTER — Other Ambulatory Visit: Payer: Self-pay

## 2023-11-02 DIAGNOSIS — Z791 Long term (current) use of non-steroidal anti-inflammatories (NSAID): Secondary | ICD-10-CM

## 2023-11-02 DIAGNOSIS — Z7982 Long term (current) use of aspirin: Secondary | ICD-10-CM

## 2023-11-02 DIAGNOSIS — Z833 Family history of diabetes mellitus: Secondary | ICD-10-CM

## 2023-11-02 DIAGNOSIS — E11628 Type 2 diabetes mellitus with other skin complications: Principal | ICD-10-CM | POA: Diagnosis present

## 2023-11-02 DIAGNOSIS — R7401 Elevation of levels of liver transaminase levels: Secondary | ICD-10-CM | POA: Diagnosis present

## 2023-11-02 DIAGNOSIS — I160 Hypertensive urgency: Secondary | ICD-10-CM | POA: Diagnosis present

## 2023-11-02 DIAGNOSIS — L03211 Cellulitis of face: Principal | ICD-10-CM | POA: Diagnosis present

## 2023-11-02 DIAGNOSIS — K047 Periapical abscess without sinus: Secondary | ICD-10-CM | POA: Diagnosis present

## 2023-11-02 DIAGNOSIS — M272 Inflammatory conditions of jaws: Secondary | ICD-10-CM

## 2023-11-02 DIAGNOSIS — Z8249 Family history of ischemic heart disease and other diseases of the circulatory system: Secondary | ICD-10-CM

## 2023-11-02 DIAGNOSIS — R000 Tachycardia, unspecified: Secondary | ICD-10-CM | POA: Diagnosis present

## 2023-11-02 DIAGNOSIS — K219 Gastro-esophageal reflux disease without esophagitis: Secondary | ICD-10-CM | POA: Diagnosis present

## 2023-11-02 DIAGNOSIS — I10 Essential (primary) hypertension: Secondary | ICD-10-CM | POA: Diagnosis present

## 2023-11-02 DIAGNOSIS — Z9049 Acquired absence of other specified parts of digestive tract: Secondary | ICD-10-CM

## 2023-11-02 DIAGNOSIS — F419 Anxiety disorder, unspecified: Secondary | ICD-10-CM | POA: Diagnosis present

## 2023-11-02 DIAGNOSIS — E1165 Type 2 diabetes mellitus with hyperglycemia: Secondary | ICD-10-CM | POA: Diagnosis present

## 2023-11-02 DIAGNOSIS — R6510 Systemic inflammatory response syndrome (SIRS) of non-infectious origin without acute organ dysfunction: Secondary | ICD-10-CM | POA: Diagnosis present

## 2023-11-02 DIAGNOSIS — Z794 Long term (current) use of insulin: Secondary | ICD-10-CM

## 2023-11-02 DIAGNOSIS — Z79899 Other long term (current) drug therapy: Secondary | ICD-10-CM

## 2023-11-02 DIAGNOSIS — F17210 Nicotine dependence, cigarettes, uncomplicated: Secondary | ICD-10-CM | POA: Diagnosis present

## 2023-11-02 DIAGNOSIS — Z7984 Long term (current) use of oral hypoglycemic drugs: Secondary | ICD-10-CM

## 2023-11-02 LAB — CBC WITH DIFFERENTIAL/PLATELET
Abs Granulocyte: 5.1 K/uL (ref 1.5–6.5)
Abs Immature Granulocytes: 0.02 K/uL (ref 0.00–0.07)
Basophils Absolute: 0 K/uL (ref 0.0–0.1)
Basophils Relative: 1 %
Eosinophils Absolute: 0 K/uL (ref 0.0–0.5)
Eosinophils Relative: 1 %
HCT: 43.1 % (ref 36.0–46.0)
Hemoglobin: 14.8 g/dL (ref 12.0–15.0)
Immature Granulocytes: 0 %
Lymphocytes Relative: 24 %
Lymphs Abs: 1.7 K/uL (ref 0.7–4.0)
MCH: 33.8 pg (ref 26.0–34.0)
MCHC: 34.3 g/dL (ref 30.0–36.0)
MCV: 98.4 fL (ref 80.0–100.0)
Monocytes Absolute: 0.5 K/uL (ref 0.1–1.0)
Monocytes Relative: 6 %
Neutro Abs: 5.1 K/uL (ref 1.7–7.7)
Neutrophils Relative %: 68 %
Platelets: 139 K/uL — ABNORMAL LOW (ref 150–400)
RBC: 4.38 MIL/uL (ref 3.87–5.11)
RDW: 11.8 % (ref 11.5–15.5)
WBC: 7.4 K/uL (ref 4.0–10.5)
nRBC: 0 % (ref 0.0–0.2)

## 2023-11-02 LAB — COMPREHENSIVE METABOLIC PANEL WITH GFR
ALT: 80 U/L — ABNORMAL HIGH (ref 0–44)
AST: 50 U/L — ABNORMAL HIGH (ref 15–41)
Albumin: 3.9 g/dL (ref 3.5–5.0)
Alkaline Phosphatase: 99 U/L (ref 38–126)
Anion gap: 12 (ref 5–15)
BUN: 14 mg/dL (ref 6–20)
CO2: 23 mmol/L (ref 22–32)
Calcium: 9.4 mg/dL (ref 8.9–10.3)
Chloride: 100 mmol/L (ref 98–111)
Creatinine, Ser: 0.8 mg/dL (ref 0.44–1.00)
GFR, Estimated: >60 mL/min (ref >60)
Glucose, Bld: 361 mg/dL — ABNORMAL HIGH (ref 70–99)
Potassium: 3.7 mmol/L (ref 3.5–5.1)
Sodium: 135 mmol/L (ref 135–145)
Total Bilirubin: 0.7 mg/dL (ref 0.0–1.2)
Total Protein: 7.4 g/dL (ref 6.5–8.1)

## 2023-11-02 NOTE — ED Triage Notes (Signed)
 Patient states left sided facial swelling since Saturday; was seen here on 10/31/2023 and given oral antibiotics. Patient states she is still having nausea, pain and swelling. Has not missed any doses of medications.

## 2023-11-02 NOTE — ED Provider Triage Note (Signed)
 Emergency Medicine Provider Triage Evaluation Note  DAJSHA MASSARO , a 43 y.o. female  was evaluated in triage.  Pt complains of increase in facial pain and swelling. She was here on Monday. She is taking antibiotics as prescribed, but not getting better. No known fever.  Physical Exam  BP (!) 190/111   Pulse (!) 111   Temp 99.7 F (37.6 C) (Oral)   Resp 20   Ht 5' 11 (1.803 m)   Wt 90.7 kg   LMP 11/17/2015 (Approximate)   SpO2 97%   BMI 27.89 kg/m  Gen:   Awake, no distress   Resp:  Normal effort  MSK:   Moves extremities without difficulty  Other:  Speech is clear. Airway is patent. Sublingual surface is soft.  Medical Decision Making  Medically screening exam initiated at 5:18 PM.  Appropriate orders placed.  Valeri Sula Speros was informed that the remainder of the evaluation will be completed by another provider, this initial triage assessment does not replace that evaluation, and the importance of remaining in the ED until their evaluation is complete.    Herlinda Kirk NOVAK, FNP 11/02/23 1719

## 2023-11-02 NOTE — Telephone Encounter (Signed)
 FYI Only or Action Required?: FYI only for provider.  Patient was last seen in primary care on 10/05/2023 by Ziglar, Susan K, MD.  Called Nurse Triage reporting Facial Swelling.  Symptoms began several days ago.  Interventions attempted: Nothing.  Symptoms are: gradually worsening.  Triage Disposition: Go to ED Now (Notify PCP)  Patient/caregiver understands and will follow disposition?: Yes    Copied from CRM [id number]. Topic: Clinical - Red Word Triage >> Nov 02, 2023  3:38 PM Sasha H wrote: Red Word that prompted transfer to Nurse Triage: Pt is having severe facial swelling, states it is her entire face now. States the pain level is outrageous Reason for Disposition  [1] SEVERE face swelling (e.g., entire face) AND [2] < 2 hours since exposure to high-risk allergen (e.g., peanuts, tree nuts, fish, shellfish or 1st dose of drug) AND [3] no serious symptoms AND [4] no serious allergic reaction in the past  Answer Assessment - Initial Assessment Questions 1. ONSET: When did the swelling start? (e.g., minutes, hours, days)     Saturday am 2. LOCATION: What part of the face is swollen? (e.g., cheek, entire face, jaw joint area, under jaw)     Face was seen in ER 3. SEVERITY: How swollen is it?     moderate 4. ITCHING: Is there any itching? If Yes, ask: How much?   (Scale 1-10; mild, moderate or severe)     Yes, mild 5. PAIN: Is the swelling painful to touch? If Yes, ask: How painful is it?   (Scale 0-10; mild, moderate or severe)     severe 6. FEVER: Do you have a fever? If Yes, ask: What is it, how was it measured, and when did it start?      Yes, 104.1 7. CAUSE: What do you think is causing the face swelling?     unknown 8. NEW MEDICINES: Have there been any new medicines started recently?     To ER 9. RECURRENT SYMPTOM: Have you had face swelling before? If Yes, ask: When was the last time? What happened that time?     To ER 10. OTHER SYMPTOMS:  Do you have any other symptoms? (e.g., leg swelling, toothache)       ER 11. PREGNANCY: Is there any chance you are pregnant? When was your last menstrual period?       ER  Protocols used: Face Swelling-A-AH

## 2023-11-03 ENCOUNTER — Emergency Department

## 2023-11-03 DIAGNOSIS — Z7982 Long term (current) use of aspirin: Secondary | ICD-10-CM | POA: Diagnosis not present

## 2023-11-03 DIAGNOSIS — Z794 Long term (current) use of insulin: Secondary | ICD-10-CM | POA: Diagnosis not present

## 2023-11-03 DIAGNOSIS — F419 Anxiety disorder, unspecified: Secondary | ICD-10-CM | POA: Diagnosis present

## 2023-11-03 DIAGNOSIS — K047 Periapical abscess without sinus: Secondary | ICD-10-CM

## 2023-11-03 DIAGNOSIS — Z833 Family history of diabetes mellitus: Secondary | ICD-10-CM | POA: Diagnosis not present

## 2023-11-03 DIAGNOSIS — K219 Gastro-esophageal reflux disease without esophagitis: Secondary | ICD-10-CM | POA: Diagnosis present

## 2023-11-03 DIAGNOSIS — I160 Hypertensive urgency: Secondary | ICD-10-CM | POA: Diagnosis present

## 2023-11-03 DIAGNOSIS — L03211 Cellulitis of face: Principal | ICD-10-CM | POA: Diagnosis present

## 2023-11-03 DIAGNOSIS — E1165 Type 2 diabetes mellitus with hyperglycemia: Secondary | ICD-10-CM | POA: Diagnosis present

## 2023-11-03 DIAGNOSIS — R6510 Systemic inflammatory response syndrome (SIRS) of non-infectious origin without acute organ dysfunction: Secondary | ICD-10-CM | POA: Diagnosis present

## 2023-11-03 DIAGNOSIS — R229 Localized swelling, mass and lump, unspecified: Secondary | ICD-10-CM | POA: Diagnosis present

## 2023-11-03 DIAGNOSIS — R7401 Elevation of levels of liver transaminase levels: Secondary | ICD-10-CM | POA: Diagnosis present

## 2023-11-03 DIAGNOSIS — Z7984 Long term (current) use of oral hypoglycemic drugs: Secondary | ICD-10-CM | POA: Diagnosis not present

## 2023-11-03 DIAGNOSIS — Z791 Long term (current) use of non-steroidal anti-inflammatories (NSAID): Secondary | ICD-10-CM | POA: Diagnosis not present

## 2023-11-03 DIAGNOSIS — F17210 Nicotine dependence, cigarettes, uncomplicated: Secondary | ICD-10-CM | POA: Diagnosis present

## 2023-11-03 DIAGNOSIS — E11628 Type 2 diabetes mellitus with other skin complications: Secondary | ICD-10-CM | POA: Diagnosis present

## 2023-11-03 DIAGNOSIS — Z8249 Family history of ischemic heart disease and other diseases of the circulatory system: Secondary | ICD-10-CM | POA: Diagnosis not present

## 2023-11-03 DIAGNOSIS — Z79899 Other long term (current) drug therapy: Secondary | ICD-10-CM | POA: Diagnosis not present

## 2023-11-03 DIAGNOSIS — Z9049 Acquired absence of other specified parts of digestive tract: Secondary | ICD-10-CM | POA: Diagnosis not present

## 2023-11-03 DIAGNOSIS — R000 Tachycardia, unspecified: Secondary | ICD-10-CM | POA: Diagnosis present

## 2023-11-03 DIAGNOSIS — I10 Essential (primary) hypertension: Secondary | ICD-10-CM | POA: Diagnosis present

## 2023-11-03 LAB — CBG MONITORING, ED
Glucose-Capillary: 285 mg/dL — ABNORMAL HIGH (ref 70–99)
Glucose-Capillary: 296 mg/dL — ABNORMAL HIGH (ref 70–99)
Glucose-Capillary: 322 mg/dL — ABNORMAL HIGH (ref 70–99)

## 2023-11-03 LAB — COMPREHENSIVE METABOLIC PANEL WITH GFR
ALT: 69 U/L — ABNORMAL HIGH (ref 0–44)
AST: 42 U/L — ABNORMAL HIGH (ref 15–41)
Albumin: 3.5 g/dL (ref 3.5–5.0)
Alkaline Phosphatase: 66 U/L (ref 38–126)
Anion gap: 10 (ref 5–15)
BUN: 13 mg/dL (ref 6–20)
CO2: 25 mmol/L (ref 22–32)
Calcium: 8.6 mg/dL — ABNORMAL LOW (ref 8.9–10.3)
Chloride: 101 mmol/L (ref 98–111)
Creatinine, Ser: 0.61 mg/dL (ref 0.44–1.00)
GFR, Estimated: >60 mL/min (ref >60)
Glucose, Bld: 293 mg/dL — ABNORMAL HIGH (ref 70–99)
Potassium: 3.6 mmol/L (ref 3.5–5.1)
Sodium: 136 mmol/L (ref 135–145)
Total Bilirubin: 0.8 mg/dL (ref 0.0–1.2)
Total Protein: 6.5 g/dL (ref 6.5–8.1)

## 2023-11-03 LAB — HCG, QUANTITATIVE, PREGNANCY: hCG, Beta Chain, Quant, S: <1 m[IU]/mL (ref <5)

## 2023-11-03 LAB — GLUCOSE, CAPILLARY
Glucose-Capillary: 248 mg/dL — ABNORMAL HIGH (ref 70–99)
Glucose-Capillary: 358 mg/dL — ABNORMAL HIGH (ref 70–99)
Glucose-Capillary: 296 mg/dL — ABNORMAL HIGH (ref 70–99)

## 2023-11-03 LAB — CBC
HCT: 38.5 % (ref 36.0–46.0)
Hemoglobin: 13.2 g/dL (ref 12.0–15.0)
MCH: 33.8 pg (ref 26.0–34.0)
MCHC: 34.3 g/dL (ref 30.0–36.0)
MCV: 98.7 fL (ref 80.0–100.0)
Platelets: 115 K/uL — ABNORMAL LOW (ref 150–400)
RBC: 3.9 MIL/uL (ref 3.87–5.11)
RDW: 11.9 % (ref 11.5–15.5)
WBC: 5.6 K/uL (ref 4.0–10.5)
nRBC: 0 % (ref 0.0–0.2)

## 2023-11-03 LAB — HIV ANTIBODY (ROUTINE TESTING W REFLEX): HIV Screen 4th Generation wRfx: NONREACTIVE

## 2023-11-03 MED ORDER — INSULIN GLARGINE-YFGN 100 UNIT/ML ~~LOC~~ SOLN
15.0000 [IU] | Freq: Every day | SUBCUTANEOUS | Status: DC
  Administered 2023-11-03: 15 [IU] via SUBCUTANEOUS
  Filled 2023-11-03 (×2): qty 0.15

## 2023-11-03 MED ORDER — AMLODIPINE BESYLATE 5 MG PO TABS
5.0000 mg | ORAL_TABLET | Freq: Once | ORAL | Status: AC
  Administered 2023-11-03: 5 mg via ORAL
  Filled 2023-11-03: qty 1

## 2023-11-03 MED ORDER — DEXAMETHASONE SODIUM PHOSPHATE 10 MG/ML IJ SOLN
8.0000 mg | Freq: Three times a day (TID) | INTRAMUSCULAR | Status: DC
  Administered 2023-11-03: 8 mg via INTRAVENOUS
  Filled 2023-11-03: qty 1

## 2023-11-03 MED ORDER — LISINOPRIL 10 MG PO TABS
10.0000 mg | ORAL_TABLET | Freq: Every day | ORAL | Status: DC
  Administered 2023-11-03 – 2023-11-04 (×2): 10 mg via ORAL
  Filled 2023-11-03 (×2): qty 1

## 2023-11-03 MED ORDER — IOHEXOL 300 MG/ML  SOLN
75.0000 mL | Freq: Once | INTRAMUSCULAR | Status: AC | PRN
  Administered 2023-11-03: 75 mL via INTRAVENOUS

## 2023-11-03 MED ORDER — DIPHENHYDRAMINE HCL 50 MG/ML IJ SOLN
25.0000 mg | Freq: Four times a day (QID) | INTRAMUSCULAR | Status: DC | PRN
  Administered 2023-11-03 – 2023-11-04 (×4): 25 mg via INTRAVENOUS
  Filled 2023-11-03 (×4): qty 1

## 2023-11-03 MED ORDER — ONDANSETRON HCL 4 MG PO TABS: 4.0000 mg | ORAL_TABLET | Freq: Four times a day (QID) | ORAL | Status: DC | PRN

## 2023-11-03 MED ORDER — NICOTINE 14 MG/24HR TD PT24
14.0000 mg | MEDICATED_PATCH | Freq: Every day | TRANSDERMAL | Status: DC
  Administered 2023-11-03 – 2023-11-04 (×2): 14 mg via TRANSDERMAL
  Filled 2023-11-03 (×2): qty 1

## 2023-11-03 MED ORDER — HYDROCHLOROTHIAZIDE 25 MG PO TABS
25.0000 mg | ORAL_TABLET | Freq: Every day | ORAL | Status: DC
  Administered 2023-11-03 – 2023-11-04 (×2): 25 mg via ORAL
  Filled 2023-11-03 (×2): qty 1

## 2023-11-03 MED ORDER — ONDANSETRON HCL 4 MG/2ML IJ SOLN: 4.0000 mg | Freq: Four times a day (QID) | INTRAMUSCULAR | Status: DC | PRN

## 2023-11-03 MED ORDER — LACTATED RINGERS IV BOLUS
1000.0000 mL | Freq: Once | INTRAVENOUS | Status: AC
  Administered 2023-11-03: 1000 mL via INTRAVENOUS

## 2023-11-03 MED ORDER — KETOROLAC TROMETHAMINE 30 MG/ML IJ SOLN
30.0000 mg | Freq: Four times a day (QID) | INTRAMUSCULAR | Status: DC | PRN
Start: 2023-11-03 — End: 2023-11-04
  Administered 2023-11-03 – 2023-11-04 (×2): 30 mg via INTRAVENOUS
  Filled 2023-11-03 (×2): qty 1

## 2023-11-03 MED ORDER — ONDANSETRON HCL 4 MG/2ML IJ SOLN
4.0000 mg | INTRAMUSCULAR | Status: AC
  Administered 2023-11-03: 4 mg via INTRAVENOUS
  Filled 2023-11-03: qty 2

## 2023-11-03 MED ORDER — MORPHINE SULFATE (PF) 4 MG/ML IV SOLN
4.0000 mg | Freq: Once | INTRAVENOUS | Status: AC
  Administered 2023-11-03: 4 mg via INTRAVENOUS
  Filled 2023-11-03: qty 1

## 2023-11-03 MED ORDER — SODIUM CHLORIDE 0.9 % IV SOLN
3.0000 g | INTRAVENOUS | Status: AC
  Administered 2023-11-03: 3 g via INTRAVENOUS
  Filled 2023-11-03: qty 8

## 2023-11-03 MED ORDER — DEXAMETHASONE SODIUM PHOSPHATE 10 MG/ML IJ SOLN
10.0000 mg | Freq: Once | INTRAMUSCULAR | Status: AC
  Administered 2023-11-03: 10 mg via INTRAVENOUS
  Filled 2023-11-03: qty 1

## 2023-11-03 MED ORDER — DIPHENHYDRAMINE HCL 50 MG/ML IJ SOLN
12.5000 mg | INTRAMUSCULAR | Status: AC
  Administered 2023-11-03: 12.5 mg via INTRAVENOUS
  Filled 2023-11-03: qty 1

## 2023-11-03 MED ORDER — SODIUM CHLORIDE 0.9 % IV SOLN
3.0000 g | Freq: Four times a day (QID) | INTRAVENOUS | Status: DC
  Administered 2023-11-03 – 2023-11-04 (×5): 3 g via INTRAVENOUS
  Filled 2023-11-03 (×7): qty 8

## 2023-11-03 MED ORDER — KETOROLAC TROMETHAMINE 30 MG/ML IJ SOLN
15.0000 mg | Freq: Once | INTRAMUSCULAR | Status: AC
  Administered 2023-11-03: 15 mg via INTRAVENOUS
  Filled 2023-11-03: qty 1

## 2023-11-03 MED ORDER — OXYCODONE-ACETAMINOPHEN 5-325 MG PO TABS
1.0000 | ORAL_TABLET | ORAL | Status: DC | PRN
  Administered 2023-11-03 – 2023-11-04 (×4): 1 via ORAL
  Filled 2023-11-03 (×4): qty 1

## 2023-11-03 MED ORDER — AMLODIPINE BESYLATE 5 MG PO TABS
5.0000 mg | ORAL_TABLET | Freq: Every day | ORAL | Status: DC
  Administered 2023-11-04: 5 mg via ORAL
  Filled 2023-11-03: qty 1

## 2023-11-03 MED ORDER — ACETAMINOPHEN 650 MG RE SUPP
650.0000 mg | Freq: Four times a day (QID) | RECTAL | Status: DC | PRN
Start: 2023-11-03 — End: 2023-11-04

## 2023-11-03 MED ORDER — ACETAMINOPHEN 325 MG PO TABS: 650.0000 mg | ORAL_TABLET | Freq: Four times a day (QID) | ORAL | Status: DC | PRN

## 2023-11-03 MED ORDER — INSULIN ASPART 100 UNIT/ML IJ SOLN
0.0000 [IU] | INTRAMUSCULAR | Status: DC
  Administered 2023-11-03: 11 [IU] via SUBCUTANEOUS
  Administered 2023-11-03 (×3): 8 [IU] via SUBCUTANEOUS
  Administered 2023-11-03: 15 [IU] via SUBCUTANEOUS
  Administered 2023-11-03: 5 [IU] via SUBCUTANEOUS
  Administered 2023-11-04 (×3): 3 [IU] via SUBCUTANEOUS
  Filled 2023-11-03 (×4): qty 1
  Filled 2023-11-03 (×2): qty 8
  Filled 2023-11-03 (×3): qty 1

## 2023-11-03 MED ORDER — MORPHINE SULFATE (PF) 2 MG/ML IV SOLN
2.0000 mg | INTRAVENOUS | Status: DC | PRN
  Administered 2023-11-03: 2 mg via INTRAVENOUS
  Filled 2023-11-03: qty 1

## 2023-11-03 NOTE — ED Provider Notes (Signed)
 [facility] Provider Note    Event Date/Time   First MD Initiated Contact with Patient 11/02/23 2337     (approximate)   History   Facial Swelling   HPI Yvonne Olson is a 43 y.o. female who presents for worsening left-sided facial and neck pain and swelling.  She was seen 2 to 3 days ago and diagnosed with a probable dental abscess with localized swelling.  She had a CT scan that was generally reassuring.  She has been taking her medications including her Augmentin , but she said despite this she has been having intermittent fevers and chills and increased pain and swelling in the left side of her face.  She said that she feels like the left side of her neck and throat are getting more swollen and that it is getting harder for her to swallow.  She has had severe nausea and not been able to eat or drink very much but has not been vomiting.  Of note, she said that she previously was a heavy drinker but she has not had any alcohol since February but knows that she might have some liver problems because of her history of heavy drinking.     Physical Exam   Triage Vital Signs: ED Triage Vitals  Encounter Vitals Group     BP 11/02/23 1715 (!) 190/111     Girls Systolic BP Percentile --      Girls Diastolic BP Percentile --      Boys Systolic BP Percentile --      Boys Diastolic BP Percentile --      Pulse Rate 11/02/23 1715 (!) 111     Resp 11/02/23 1715 20     Temp 11/02/23 1715 99.7 F (37.6 C)     Temp Source 11/02/23 1715 Oral     SpO2 11/02/23 1715 97 %     Weight 11/02/23 1716 90.7 kg (200 lb)     Height 11/02/23 1716 1.803 m (5' 11)     Head Circumference --      Peak Flow --      Pain Score 11/02/23 1716 8     Pain Loc --      Pain Education --      Exclude from Growth Chart --     Most recent vital signs: Vitals:   11/03/23 0102 11/03/23 0238  BP: (!) 176/105   Pulse:    Resp:    Temp:  98.5 F (36.9 C)  SpO2:       General: Awake, no distress.  Head/neck: Oropharynx is patent with no obvious concerning intraoral or oropharyngeal infections like Ludwig's angina.  She has no trismus.  Externally she has some obvious left-sided submandibular swelling and tenderness with induration to palpation.  This extends a bit down her neck.  No stridor. CV:  Good peripheral perfusion.  Resp:  Normal effort. Speaking easily and comfortably, no accessory muscle usage nor intercostal retractions.  Lungs clear to auscultation bilaterally. Abd:  No distention.  Mild tenderness to palpation of the right upper quadrant status post cholecystectomy.  No epigastric or lower abdominal tenderness.   ED Results / Procedures / Treatments   Labs (all labs ordered are listed, but only abnormal results are displayed) Labs Reviewed  CBC WITH DIFFERENTIAL/PLATELET - Abnormal; Notable for the following components:      Result Value   Platelets 139 (*)    All other components within normal limits  COMPREHENSIVE METABOLIC PANEL WITH GFR -  Abnormal; Notable for the following components:   Glucose, Bld 361 (*)    AST 50 (*)    ALT 80 (*)    All other components within normal limits  CBG MONITORING, ED - Abnormal; Notable for the following components:   Glucose-Capillary 285 (*)    All other components within normal limits  HIV ANTIBODY (ROUTINE TESTING W REFLEX)  COMPREHENSIVE METABOLIC PANEL WITH GFR  CBC     RADIOLOGY See ED course for details   PROCEDURES:  Critical Care performed: No  Procedures    IMPRESSION / MDM / ASSESSMENT AND PLAN / ED COURSE  I reviewed the triage vital signs and the nursing notes.                              Differential diagnosis includes, but is not limited to, facial cellulitis, facial abscess, neck cellulitis, neck abscess, retropharyngeal abscess, failure of outpatient antibiosis, epiglottitis, tonsillitis, pharyngitis, Ludwig's angina or other odontogenic  infection.  Patient's presentation is most consistent with acute presentation with potential threat to life or bodily function.  Labs/studies ordered: CMP, CBC with differential, CT soft tissue neck  Interventions/Medications given:  Medications  dexamethasone  (DECADRON ) injection 8 mg (8 mg Intravenous Given 11/03/23 0524)  Ampicillin -Sulbactam (UNASYN ) 3 g in sodium chloride  0.9 % 100 mL IVPB (has no administration in time range)  oxyCODONE -acetaminophen  (PERCOCET/ROXICET) 5-325 MG per tablet 1 tablet (has no administration in time range)  amLODipine  (NORVASC ) tablet 5 mg (has no administration in time range)  hydrochlorothiazide  (HYDRODIURIL ) tablet 25 mg (25 mg Oral Given 11/03/23 0523)  lisinopril  (ZESTRIL ) tablet 10 mg (has no administration in time range)  acetaminophen  (TYLENOL ) tablet 650 mg (has no administration in time range)    Or  acetaminophen  (TYLENOL ) suppository 650 mg (has no administration in time range)  ondansetron  (ZOFRAN ) tablet 4 mg (has no administration in time range)    Or  ondansetron  (ZOFRAN ) injection 4 mg (has no administration in time range)  insulin  aspart (novoLOG ) injection 0-15 Units (8 Units Subcutaneous Given 11/03/23 0535)  ketorolac  (TORADOL ) 30 MG/ML injection 30 mg (has no administration in time range)  morphine  (PF) 2 MG/ML injection 2 mg (has no administration in time range)  ondansetron  (ZOFRAN ) injection 4 mg (4 mg Intravenous Given 11/03/23 0108)  diphenhydrAMINE  (BENADRYL ) injection 12.5 mg (12.5 mg Intravenous Given 11/03/23 0109)  ketorolac  (TORADOL ) 30 MG/ML injection 15 mg (15 mg Intravenous Given 11/03/23 0106)  lactated ringers  bolus 1,000 mL (0 mLs Intravenous Stopped 11/03/23 0203)  amLODipine  (NORVASC ) tablet 5 mg (5 mg Oral Given 11/03/23 0102)  iohexol  (OMNIPAQUE ) 300 MG/ML solution 75 mL (75 mLs Intravenous Contrast Given 11/03/23 0125)  Ampicillin -Sulbactam (UNASYN ) 3 g in sodium chloride  0.9 % 100 mL IVPB (0 g Intravenous Stopped  11/03/23 0342)  dexamethasone  (DECADRON ) injection 10 mg (10 mg Intravenous Given 11/03/23 0308)  morphine  (PF) 4 MG/ML injection 4 mg (4 mg Intravenous Given 11/03/23 0307)    (Note:  hospital course my include additional interventions and/or labs/studies not listed above.)   Patient is hypertensive but this is apparently her baseline.  She has not yet started back up on her outpatient amlodipine  as previously discussed because she said it was not at the pharmacy like she expected.  She seems to be asymptomatic from the uncontrolled hypertension perspective.  However she has some obvious left-sided swelling and it is worrisome that she may be worsening despite outpatient antibiotics.  Her  heart rate is 111 but she does not meet SIRS/sepsis criteria at this point.  No leukocytosis.  She has mild LFT elevation which is likely related to her chronic/prior alcohol use.  She has not been vomiting and she has no elevated T. bili.  Medications as listed above including some Benadryl  because she said that she has been red and somewhat itchy and it could be related to her Augmentin  although this is unlikely because she has had penicillin in the past.  Evaluating with another CT soft tissue neck and will reassess.    Clinical Course as of 11/03/23 9388  Thu Nov 03, 2023  0257 CT Soft Tissue Neck W Contrast I independently viewed and interpreted the patient's CT soft tissue neck.  There still seems to be some swelling inflammation.  The radiologist felt that the appearance was similar, possibly slightly better than before, but she has developed an abscess about 7 mm in diameter.  I reassessed her and she is still in a lot of pain and has visible swelling.  Given subjective worsening and certainly no improvement despite about 2 to 3 days of outpatient treatment, I think she would benefit from inpatient treatment with IV antibiotics and steroids.  I ordered Unasyn  3 g IV and Decadron  10 mg IV and will consult  the hospitalist team for admission.  No indication for emergent ENT intervention at this time. [CF]    Clinical Course User Index [CF] Gordan Huxley, MD     FINAL CLINICAL IMPRESSION(S) / ED DIAGNOSES   Final diagnoses:  Facial cellulitis  Abscess of maxilla     Rx / DC Orders   ED Discharge Orders     None        Note:  This document was prepared using Dragon voice recognition software and may include unintentional dictation errors.   Gordan Huxley, MD 11/03/23 [phone]

## 2023-11-03 NOTE — Progress Notes (Signed)
 Interval progress note not for billing.  Hx dm, htn, tobacco abuse, here with odontogenic abscess with cellulitis, failed outpt augmentin  started on 8/11. No danger signs/symptoms. Stable, improving some with steroids and unasyn . Will d/c steroids given active infection and uncontrolled dm. Will review abx with ID. Looks like there is an Transport planner on call, will review with them, and ent if that doesn't pan out.

## 2023-11-03 NOTE — Assessment & Plan Note (Signed)
 Very mild.  Can continue to trend

## 2023-11-03 NOTE — Assessment & Plan Note (Signed)
 Continue home amlodipine  and hydrochlorothiazide 

## 2023-11-03 NOTE — Plan of Care (Signed)

## 2023-11-03 NOTE — Inpatient Diabetes Management (Signed)
 Inpatient Diabetes Program Recommendations  AACE/ADA: New Consensus Statement on Inpatient Glycemic Control (2015)  Target Ranges:  Prepandial:   less than 140 mg/dL      Peak postprandial:   less than 180 mg/dL (1-2 hours)      Critically ill patients:  140 - 180 mg/dL    Latest Reference Range & Units 10/05/23 13:53  Hemoglobin A1C 4.0 - 5.6 % 8.4 !  !: Data is abnormal  Latest Reference Range & Units 11/02/23 17:18  Glucose 70 - 99 mg/dL 638 (H)  (H): Data is abnormally high  Latest Reference Range & Units 11/03/23 05:23  Glucose-Capillary 70 - 99 mg/dL  10 mg Decadron  @0308  285 (H)  8 units Novolog   8 mg Decadron    (H): Data is abnormally high   Admit with: Odontogenic Facial Cellulitis  History: DM  Home DM Meds: Novolog  Mix 70/30 Insulin  20-25 units BID  Current Orders: Novolog  Moderate Correction Scale/ SSI (0-15 units) Q4 hours    MD- Note pt getting Decadron  8 mg Q8H CBG 285 this AM--Novolog  SSI started at 5am Pt takes 70/30 Insulin  BID at home Currently NPO so not candidate for 70/30 Insulin   Please consider starting Semglee  20 units daily (~0.25 units/kg) Please start this AM    --Will follow patient during hospitalization--  Adina Rudolpho Arrow RN, MSN, CDCES Diabetes Coordinator Inpatient Glycemic Control Team Team Pager: [phone] (8a-5p)

## 2023-11-03 NOTE — Progress Notes (Signed)
 Pharmacy Antibiotic Note  Yvonne Olson is a 43 y.o. female admitted on 11/02/2023 with facial cellulitis.  Pharmacy has been consulted for Unasyn  dosing.  Plan: Unasyn  3 gm IV X 1 given in ED on 8/14 @ 0312. Unasyn  3 gm IV Q6H ordered to start on 8/14 @ 0900.   Height: 5' 11 (180.3 cm) Weight: 90.7 kg (200 lb) IBW/kg (Calculated) : 70.8  Temp (24hrs), Avg:99.1 F (37.3 C), Min:98.5 F (36.9 C), Max:99.7 F (37.6 C)  Recent Labs  Lab 10/31/23 0858 11/02/23 1718  WBC 8.6 7.4  CREATININE 0.70 0.80    Estimated Creatinine Clearance: 111.6 mL/min (by C-G formula based on SCr of 0.8 mg/dL).    No Known Allergies  Antimicrobials this admission:   >>    >>   Dose adjustments this admission:   Microbiology results:  BCx:   UCx:    Sputum:    MRSA PCR:   Thank you for allowing pharmacy to be a part of this patient's care.  Oleg Oleson D 11/03/2023 4:20 AM

## 2023-11-03 NOTE — Assessment & Plan Note (Signed)
 Sliding scale insulin  coverage

## 2023-11-03 NOTE — Plan of Care (Signed)
   Problem: Nutritional: Goal: Maintenance of adequate nutrition will improve Outcome: Progressing   Problem: Education: Goal: Knowledge of General Education information will improve Description: Including pain rating scale, medication(s)/side effects and non-pharmacologic comfort measures Outcome: Progressing

## 2023-11-03 NOTE — TOC CM/SW Note (Signed)
 Transition of Care [facility]) - Inpatient Brief Assessment   Patient Details  Name: Yvonne Olson MRN: [id number] Date of Birth: May 15, 1980  Transition of Care [facility]) CM/SW Contact:    Asberry CHRISTELLA Jaksch, RN Phone Number: 11/03/2023, 12:41 PM   Clinical Narrative:   Transition of Care [facility]) Screening Note   Patient Details  Name: Yvonne Olson Date of Birth: 1980/05/16   Transition of Care [facility]) CM/SW Contact:    Asberry CHRISTELLA Jaksch, RN Phone Number: 11/03/2023, 12:41 PM    Transition of Care Department [facility]) has reviewed patient and no TOC needs have been identified at this time. If new patient transition needs arise, please place a TOC consult.    Transition of Care Asessment: Insurance and Status: Insurance coverage has been reviewed Patient has primary care physician: Yes     Prior/Current Home Services: No current home services Social Drivers of Health Review: SDOH reviewed no interventions necessary Readmission risk has been reviewed: Yes Transition of care needs: no transition of care needs at this time

## 2023-11-03 NOTE — Assessment & Plan Note (Signed)
 Periapical abscess SIRS SIRS criteria include low-grade temp and tachycardia Continue Unasyn  Will continue Decadron  Pain control Can consider ENT consult in case of need for drainage of abscess

## 2023-11-03 NOTE — H&P (Signed)
 History and Physical    Patient: Yvonne Olson DOB: [birth year] DOA: 11/02/2023 DOS: the patient was seen and examined on 11/03/2023 PCP: Ziglar, Susan K, MD  Patient coming from: Home  Chief Complaint:  Chief Complaint  Patient presents with   Facial Swelling    HPI: Yvonne Olson is a 43 y.o. female with medical history significant for DM and HTN being admitted with odontogenic facial cellulitis not improving after 4 days of outpatient antibiotics.  She denies fever or chills. In the ED, BP 190/111, temp 99.7, tachycardic to 111.  Labs showed normal WBC.  Otherwise notable for glucose 361, noted mild transaminitis with AST 50 and ALT 80.CT soft tissue neck showing persistent swelling left neck and new 7 mm odontogenic abscess left maxilla. Patient started on Unasyn  and given a dose of Decadron  and Benadryl .  Also given a dose of amlodipine  for blood pressure. Admission requested.     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 She has been exposed to tobacco  smoke. She has never used smokeless tobacco. She reports current alcohol use. She reports that she does not use drugs.  No Known Allergies  Family History  Problem Relation Age of Onset   Cancer Other    Diabetes Other    CAD Other     Prior to Admission medications   Medication Sig Start Date End Date Taking? Authorizing Provider  acetaminophen  (TYLENOL ) 325 MG tablet Take 650 mg by mouth every 6 (six) hours as needed.    [provider]  albuterol  (VENTOLIN  HFA) 108 (90 Base) MCG/ACT inhaler Inhale 1 puff into the lungs every 6 (six) hours as needed for wheezing or shortness of breath. 10/05/23   Ziglar, Susan K, MD  amLODipine  (NORVASC ) 5 MG tablet Take 1 tablet (5 mg total) by mouth daily. 10/05/23 10/04/24  Ziglar, Susan K, MD  amoxicillin -clavulanate (AUGMENTIN ) 875-125 MG tablet Take 1 tablet by mouth 2 (two) times daily for 7 days. 10/31/23 11/07/23  Suzanne Kirsch, MD  aspirin EC 81 MG tablet Take 81 mg by mouth daily. Swallow whole.    [provider]  aspirin-acetaminophen -caffeine (EXCEDRIN MIGRAINE) 250-250-65 MG tablet Take by mouth every 6 (six) hours as needed for headache.    [provider]  Blood Glucose Monitoring Suppl (BLOOD GLUCOSE MONITOR SYSTEM) w/Device KIT 1 each by Does not apply route in the morning, at noon, and at bedtime. May substitute to any manufacturer covered by patient's insurance. 10/05/23   Ziglar, Susan K, MD  cloNIDine (CATAPRES) 0.1  MG tablet Take by mouth. 09/16/22   [provider]  cyclobenzaprine  (FLEXERIL ) 10 MG tablet Take 1 tablet (10 mg total) by mouth 2 (two) times daily as needed for muscle spasms. 09/09/20   Horton, Charmaine FALCON, MD  diclofenac (VOLTAREN) 75 MG EC tablet Take by mouth. 04/02/21   [provider]  famotidine (PEPCID) 20 MG tablet Take by mouth. 09/16/22   [provider]  fluconazole (DIFLUCAN) 150 MG tablet Take one tablet on day 1 and repeat with second tablet after 3-5 days if  symptoms persist. Patient not taking: Reported on 04/14/2023 11/18/20   [provider]  Glucose Blood (BLOOD GLUCOSE TEST STRIPS) STRP 1 each by In Vitro route in the morning, at noon, and at bedtime. May substitute to any manufacturer covered by patient's insurance. 10/05/23 11/08/23  Ziglar, Susan K, MD  hydrochlorothiazide  (HYDRODIURIL ) 25 MG tablet Take 1 tablet (25 mg total) by mouth daily. 10/05/23   Ziglar, Susan K, MD  ibuprofen  (ADVIL ) 600 MG tablet Take 1 tablet (600 mg total) by mouth every 6 (six) hours as needed. 09/09/20   Horton, Charmaine FALCON, MD  ibuprofen  (ADVIL ,MOTRIN ) 800 MG tablet Take 1 tablet (800 mg total) by mouth 3 (three) times daily. 11/26/15   Van Knee, MD  insulin  aspart (NOVOLOG ) 100 UNIT/ML FlexPen Sliding scale insulin . 150-200: 1 unit SQ, 201-250: 2unit SQ, 251-300: 3 unit SQ, 301-350:4 unit SQ, >350: 6unit SQ Patient not taking: Reported on 10/05/2023 04/06/23   Dorothyann Drivers, MD  insulin  aspart protamine- aspart (NOVOLOG  MIX 70/30) (70-30) 100 UNIT/ML injection Inject 20-25 units into the skin 2 (two) times daily with meals. 03/30/21   [provider]  Insulin  Pen Needle 32G X 4 MM MISC Use with Novolog  04/07/23   Dorothyann Drivers, MD  Lancet Device MISC 1 each by Does not apply route in the morning, at noon, and at bedtime. May substitute to any manufacturer covered by patient's insurance. 10/05/23 11/04/23  Ziglar, Susan K, MD  lisinopril  (ZESTRIL ) 10 MG tablet Take 1 tablet (10 mg total) by mouth daily. 09/09/20   Horton, Charmaine FALCON, MD  metFORMIN  (GLUCOPHAGE ) 500 MG tablet Take 1 tablet (500 mg total) by mouth 2 (two) times daily with a meal. 10/05/23   Ziglar, Susan K, MD  ondansetron  (ZOFRAN ) 4 MG tablet Take 1 tablet (4 mg total) by mouth every 8 (eight) hours as needed for nausea or vomiting. 10/05/23   Ziglar, Susan K, MD  ondansetron  (ZOFRAN -ODT) 4 MG disintegrating tablet Take 1 tablet (4 mg total) by mouth every 8 (eight) hours as needed for  nausea or vomiting. 10/31/23   Suzanne Kirsch, MD  oxyCODONE -acetaminophen  (PERCOCET) 5-325 MG tablet Take 1 tablet by mouth every 4 (four) hours as needed for up to 3 days for severe pain (pain score 7-10). 10/31/23 11/03/23  Suzanne Kirsch, MD  pantoprazole  (PROTONIX ) 40 MG tablet Take 1 tablet (40 mg total) by mouth daily. 10/05/23   Ziglar, Susan K, MD  TRUEplus Lancets 33G MISC 1 each by Does not apply route in the morning, at noon, and at bedtime. May substitute to any manufacturer covered by patient's insurance. 10/05/23 11/04/23  Ziglar, Susan K, MD    Physical Exam: Vitals:   11/02/23 1716 11/03/23 0100 11/03/23 0102 11/03/23 0238  BP:  (!) 176/105 (!) 176/105   Pulse:  82    Resp:  18    Temp:    98.5 F (36.9 C)  TempSrc:    Oral  SpO2:  99%    Weight: 90.7 kg     Height: 5' 11 (1.803 m)      Physical Exam Vitals and nursing note reviewed.  Constitutional:      General: She is not in acute distress. HENT:     Head: Normocephalic and atraumatic.     Comments: Redness and swelling over lower half of left face extending down neck Cardiovascular:     Rate and Rhythm: Normal rate and regular rhythm.     Heart sounds: Normal heart sounds.  Pulmonary:     Effort: Pulmonary effort is normal.     Breath sounds: Normal breath sounds.  Abdominal:     Palpations: Abdomen is soft.     Tenderness: There is no abdominal tenderness.  Neurological:     Mental Status: Mental status is at baseline.     Labs on Admission: I have personally reviewed following labs and imaging studies  CBC: Recent Labs  Lab 10/31/23 0858 11/02/23 1718  WBC 8.6 7.4  NEUTROABS 6.3 5.1  HGB 14.9 14.8  HCT 42.6 43.1  MCV 96.6 98.4  PLT 120* 139*   Basic Metabolic Panel: Recent Labs  Lab 10/31/23 0858 11/02/23 1718  NA 130* 135  K 4.2 3.7  CL 98 100  CO2 21* 23  GLUCOSE 324* 361*  BUN 15 14  CREATININE 0.70 0.80  CALCIUM 9.3 9.4   GFR: Estimated Creatinine Clearance: 111.6 mL/min (by  C-G formula based on SCr of 0.8 mg/dL). Liver Function Tests: Recent Labs  Lab 11/02/23 1718  AST 50*  ALT 80*  ALKPHOS 99  BILITOT 0.7  PROT 7.4  ALBUMIN 3.9   No results for input(s): LIPASE, AMYLASE in the last 168 hours. No results for input(s): AMMONIA in the last 168 hours. Coagulation Profile: No results for input(s): INR, PROTIME in the last 168 hours. Cardiac Enzymes: No results for input(s): CKTOTAL, CKMB, CKMBINDEX, TROPONINI in the last 168 hours. BNP (last 3 results) No results for input(s): PROBNP in the last 8760 hours. HbA1C: No results for input(s): HGBA1C in the last 72 hours. CBG: Recent Labs  Lab 10/31/23 1141 10/31/23 1316  GLUCAP 336* 314*   Lipid Profile: No results for input(s): CHOL, HDL, LDLCALC, TRIG, CHOLHDL, LDLDIRECT in the last 72 hours. Thyroid  Function Tests: No results for input(s): TSH, T4TOTAL, FREET4, T3FREE, THYROIDAB in the last 72 hours. Anemia Panel: No results for input(s): VITAMINB12, FOLATE, FERRITIN, TIBC, IRON, RETICCTPCT in the last 72 hours. Urine analysis:    Component Value Date/Time   COLORURINE YELLOW (A) 04/06/2023 1221   APPEARANCEUR CLEAR (A) 04/06/2023 1221   APPEARANCEUR Clear 08/25/2013 2203   LABSPEC 1.012 04/06/2023 1221   LABSPEC 1.005 08/25/2013 2203   PHURINE 5.0 04/06/2023 1221   GLUCOSEU >=500 (A) 04/06/2023 1221   GLUCOSEU Negative 08/25/2013 2203   HGBUR NEGATIVE 04/06/2023 1221   BILIRUBINUR NEGATIVE 04/06/2023 1221   BILIRUBINUR Negative 08/25/2013 2203   KETONESUR 20 (A) 04/06/2023 1221   PROTEINUR NEGATIVE 04/06/2023 1221   NITRITE NEGATIVE 04/06/2023 1221   LEUKOCYTESUR NEGATIVE 04/06/2023 1221   LEUKOCYTESUR Negative 08/25/2013 2203    Radiological Exams on Admission: CT Soft Tissue Neck W Contrast Result Date: 11/03/2023 CLINICAL DATA:  Initial evaluation for acute left facial/neck pain and swelling. Mild dental undo that EXAM: CT  NECK WITH CONTRAST TECHNIQUE: Multidetector CT imaging of the neck was performed using the standard protocol following the bolus administration of intravenous contrast. RADIATION DOSE REDUCTION: This exam was performed according to  the departmental dose-optimization program which includes automated exposure control, adjustment of the mA and/or kV according to patient size and/or use of iterative reconstruction technique. CONTRAST:  75mL OMNIPAQUE  IOHEXOL  300 MG/ML  SOLN COMPARISON:  CT from 10/31/2023 FINDINGS: Pharynx and larynx: Few scattered periapical lucencies noted about the left maxillary and mandibular molars. Persistent swelling with hazy inflammatory stranding within the lower left face adjacent to the left mandible, suggesting acute infection/cellulitis. Overall, appearance is improved as compared to prior CT. However, now evident is a small odontogenic abscess along the buccal aspect of the left maxilla measuring 7 mm (series 2, image 27). This is immediately adjacent to the left first and second maxillary molars, likely the source of infection. No extension of inflammatory changes into the deeper spaces of the face/neck at this time. Oral cavity within normal limits. Oropharynx and nasopharynx within normal limits. No retropharyngeal collection or swelling. Negative epiglottis. Hypopharynx, supraglottic larynx, and glottis within normal limits. Subglottic airway clear. Salivary glands: Salivary glands including the parotid and submandibular glands are within normal limits. Thyroid : Normal. Lymph nodes: Mild asymmetric prominence of subcentimeter left upper cervical lymph nodes, presumably reactive. No other enlarged or pathologic lymph nodes within the neck. Vascular: Normal intravascular enhancement seen within the neck. Mild atheromatous change about the carotid bifurcations without stenosis. Limited intracranial: Unremarkable. Visualized orbits: Unremarkable. Mastoids and visualized paranasal  sinuses: Mild-to-moderate mucosal thickening noted about the left maxillary sinus. Paranasal sinuses are otherwise clear. Visualized mastoids and middle ear cavities are well pneumatized and free of fluid. Skeleton: No discrete or worrisome osseous lesions. Moderate spondylosis present at C5-6. Upper chest: No other acute finding. Other: None. IMPRESSION: 1. Persistent swelling with hazy inflammatory stranding within the lower left face adjacent to the left mandible, suggesting persistent infection/cellulitis. Overall, appearance is improved as compared to prior CT from 10/31/2023. 2. Interval development of a 7 mm odontogenic abscess along the buccal aspect of the left maxilla, immediately adjacent to the left first and second maxillary molars, likely the source of infection. 3. Mild-to-moderate left maxillary sinusitis. Electronically Signed   By: Morene Hoard M.D.   On: 11/03/2023 02:12   Data Reviewed for HPI: Relevant notes from primary care and specialist visits, past discharge summaries as available in EHR, including Care Everywhere. Prior diagnostic testing as pertinent to current admission diagnoses Updated medications and problem lists for reconciliation ED course, including vitals, labs, imaging, treatment and response to treatment Triage notes, nursing and pharmacy notes and ED provider's notes Notable results as noted above in HPI      Assessment and Plan: * Facial cellulitis Periapical abscess SIRS SIRS criteria include low-grade temp and tachycardia Continue Unasyn  Will continue Decadron  Pain control Can consider ENT consult in case of need for drainage of abscess  Type 2 diabetes mellitus with hyperglycemia (HCC) Sliding scale insulin  coverage  Hypertensive urgency Continue home amlodipine  and hydrochlorothiazide   Transaminitis Very mild.  Can continue to trend     DVT prophylaxis: SCD  Consults: ENT  Advance Care Planning: full code  Family  Communication: none  Disposition Plan: Back to previous home environment  Severity of Illness: The appropriate patient status for this patient is OBSERVATION. Observation status is judged to be reasonable and necessary in order to provide the required intensity of service to ensure the patient's safety. The patient's presenting symptoms, physical exam findings, and initial radiographic and laboratory data in the context of their medical condition is felt to place them at decreased risk for further clinical deterioration.  Furthermore, it is anticipated that the patient will be medically stable for discharge from the hospital within 2 midnights of admission.   Author: Delayne LULLA Solian, MD 11/03/2023 4:15 AM  For on call review [email or web address].

## 2023-11-04 ENCOUNTER — Other Ambulatory Visit (HOSPITAL_COMMUNITY): Payer: Self-pay | Admitting: Pharmacist

## 2023-11-04 ENCOUNTER — Other Ambulatory Visit: Payer: Self-pay

## 2023-11-04 DIAGNOSIS — L03211 Cellulitis of face: Secondary | ICD-10-CM | POA: Diagnosis not present

## 2023-11-04 LAB — BASIC METABOLIC PANEL WITH GFR
Anion gap: 8 (ref 5–15)
BUN: 13 mg/dL (ref 6–20)
CO2: 28 mmol/L (ref 22–32)
Calcium: 9.3 mg/dL (ref 8.9–10.3)
Chloride: 103 mmol/L (ref 98–111)
Creatinine, Ser: 0.64 mg/dL (ref 0.44–1.00)
GFR, Estimated: >60 mL/min (ref >60)
Glucose, Bld: 151 mg/dL — ABNORMAL HIGH (ref 70–99)
Potassium: 3.4 mmol/L — ABNORMAL LOW (ref 3.5–5.1)
Sodium: 139 mmol/L (ref 135–145)

## 2023-11-04 LAB — CBC
HCT: 39.5 % (ref 36.0–46.0)
Hemoglobin: 13.6 g/dL (ref 12.0–15.0)
MCH: 33.4 pg (ref 26.0–34.0)
MCHC: 34.4 g/dL (ref 30.0–36.0)
MCV: 97.1 fL (ref 80.0–100.0)
Platelets: 141 K/uL — ABNORMAL LOW (ref 150–400)
RBC: 4.07 MIL/uL (ref 3.87–5.11)
RDW: 11.9 % (ref 11.5–15.5)
WBC: 9.6 K/uL (ref 4.0–10.5)
nRBC: 0 % (ref 0.0–0.2)

## 2023-11-04 LAB — GLUCOSE, CAPILLARY
Glucose-Capillary: 160 mg/dL — ABNORMAL HIGH (ref 70–99)
Glucose-Capillary: 173 mg/dL — ABNORMAL HIGH (ref 70–99)
Glucose-Capillary: 184 mg/dL — ABNORMAL HIGH (ref 70–99)

## 2023-11-04 MED ORDER — AMOXICILLIN-POT CLAVULANATE 875-125 MG PO TABS
1.0000 | ORAL_TABLET | Freq: Two times a day (BID) | ORAL | 0 refills | Status: AC
  Filled 2023-11-04: qty 14, 7d supply, fill #0

## 2023-11-04 MED ORDER — OXYCODONE HCL 5 MG PO TABS
5.0000 mg | ORAL_TABLET | Freq: Four times a day (QID) | ORAL | 0 refills | Status: AC | PRN
  Filled 2023-11-04: qty 6, 2d supply, fill #0

## 2023-11-04 NOTE — Discharge Summary (Signed)
 Yvonne Olson Tutton FMW:[id number] DOB: [birth year] DOA: 11/02/2023  PCP: Ziglar, Susan K, MD  Admit date: 11/02/2023 Discharge date: 11/04/2023  Time spent: 35 minutes  Recommendations for Outpatient Follow-up:  Pcp f/u Oral surgery (Dr. Joanette) f/u for tooth extraction     Discharge Diagnoses:  Principal Problem:   Facial cellulitis Active Problems:   Periapical abscess   Type 2 diabetes mellitus with hyperglycemia (HCC)   Hypertension   Transaminitis   Hypertensive urgency   SIRS (systemic inflammatory response syndrome) (HCC)   Discharge Condition: improved  Diet recommendation: carb modified  Filed Weights   11/02/23 1716  Weight: 90.7 kg    History of present illness:  From admission h and p Yvonne Olson is a 43 y.o. female with medical history significant for DM and HTN being admitted with odontogenic facial cellulitis not improving after 4 days of outpatient antibiotics.  She denies fever or chills.   Hospital Course:  Hx dm, htn, tobacco abuse, here with odontogenic abscess with cellulitis, failed outpt augmentin  started on 8/11. No danger signs/symptoms. Treated with steroids initially (discontinued) and unasyn . Now much improved. Reviewed with Dr. Joanette of oral surgery who advises a course of abx and outpatient f/u for tooth extraction. Reviewed abx with [facility] pharm who advised revert back to augmentin  at discharge (vs switch to other antibiotic).  Procedures: none   Consultations: Dr. Joanette oral surgery (telephone consult)  Discharge Exam: Vitals:   11/04/23 0342 11/04/23 0753  BP: 129/77 (!) 168/102  Pulse: (!) 57 60  Resp: 18   Temp: 98.1 F (36.7 C) 97.9 F (36.6 C)  SpO2: 97% 99%    General: NAD Cardiovascular: RRR Respiratory: CTAB  Discharge Instructions   Discharge Instructions     Diet Carb Modified   Complete by: As directed    Increase activity slowly   Complete by: As directed       Allergies as of 11/04/2023   No Known  Allergies      Medication List     STOP taking these medications    fluconazole 150 MG tablet Commonly known as: DIFLUCAN   lisinopril  10 MG tablet Commonly known as: ZESTRIL    metFORMIN  500 MG tablet Commonly known as: GLUCOPHAGE    NovoLOG  FlexPen 100 UNIT/ML FlexPen Generic drug: insulin  aspart   ondansetron  4 MG tablet Commonly known as: ZOFRAN    oxyCODONE -acetaminophen  5-325 MG tablet Commonly known as: Percocet   pantoprazole  40 MG tablet Commonly known as: PROTONIX        TAKE these medications    Accu-Chek Guide Test test strip Generic drug: glucose blood 1 each by In Vitro route in the morning, at noon, and at bedtime. May substitute to any manufacturer covered by patient's insurance.   Accu-Chek Softclix Lancets lancets 1 each by Does not apply route in the morning, at noon, and at bedtime. May substitute to any manufacturer covered by patient's insurance.   acetaminophen  325 MG tablet Commonly known as: TYLENOL  Take 650 mg by mouth every 6 (six) hours as needed.   amLODipine  5 MG tablet Commonly known as: NORVASC  Take 1 tablet (5 mg total) by mouth daily.   amoxicillin -clavulanate 875-125 MG tablet Commonly known as: AUGMENTIN  Take 1 tablet by mouth 2 (two) times daily for 7 days.   aspirin EC 81 MG tablet Take 81 mg by mouth daily. Swallow whole.   aspirin-acetaminophen -caffeine 250-250-65 MG tablet Commonly known as: EXCEDRIN MIGRAINE Take by mouth every 6 (six) hours as needed for headache.   BD  Pen Needle Nano 2nd Gen 32G X 4 MM Misc Generic drug: Insulin  Pen Needle Use with Novolog    Blood Glucose Monitor System w/Device Kit 1 each by Does not apply route in the morning, at noon, and at bedtime. May substitute to any manufacturer covered by patient's insurance.   famotidine 20 MG tablet Commonly known as: PEPCID Take 20 mg by mouth daily as needed.   hydrochlorothiazide  25 MG tablet Commonly known as: HYDRODIURIL  Take 1 tablet  (25 mg total) by mouth daily.   insulin  aspart protamine- aspart (70-30) 100 UNIT/ML injection Commonly known as: NOVOLOG  MIX 70/30 Inject 20-25 units into the skin 2 (two) times daily with meals.   Lancet Device Misc 1 each by Does not apply route in the morning, at noon, and at bedtime. May substitute to any manufacturer covered by patient's insurance.   ondansetron  4 MG disintegrating tablet Commonly known as: ZOFRAN -ODT Take 1 tablet (4 mg total) by mouth every 8 (eight) hours as needed for nausea or vomiting.   oxyCODONE  5 MG immediate release tablet Commonly known as: Roxicodone  Take 1 tablet (5 mg total) by mouth every 6 (six) hours as needed for severe pain (pain score 7-10).   Ventolin  HFA 108 (90 Base) MCG/ACT inhaler Generic drug: albuterol  Inhale 1 puff into the lungs every 6 (six) hours as needed for wheezing or shortness of breath.       No Known Allergies  Follow-up Information     Ziglar, Susan K, MD Follow up.   Specialty: Family Medicine Contact information: [address] [place] [address] [phone]         Joanette Soulier, DMD Follow up.   Specialty: Oral Surgery Why: call to schedule tooth extraction Contact information: [address] [place] [address] [phone]                  The results of significant diagnostics from this hospitalization (including imaging, microbiology, ancillary and laboratory) are listed below for reference.    Significant Diagnostic Studies: CT Soft Tissue Neck W Contrast Result Date: 11/03/2023 CLINICAL DATA:  Initial evaluation for acute left facial/neck pain and swelling. Mild dental undo that EXAM: CT NECK WITH CONTRAST TECHNIQUE: Multidetector CT imaging of the neck was performed using the standard protocol following the bolus administration of intravenous contrast. RADIATION DOSE REDUCTION: This exam was performed according to the departmental dose-optimization program which includes  automated exposure control, adjustment of the mA and/or kV according to patient size and/or use of iterative reconstruction technique. CONTRAST:  75mL OMNIPAQUE  IOHEXOL  300 MG/ML  SOLN COMPARISON:  CT from 10/31/2023 FINDINGS: Pharynx and larynx: Few scattered periapical lucencies noted about the left maxillary and mandibular molars. Persistent swelling with hazy inflammatory stranding within the lower left face adjacent to the left mandible, suggesting acute infection/cellulitis. Overall, appearance is improved as compared to prior CT. However, now evident is a small odontogenic abscess along the buccal aspect of the left maxilla measuring 7 mm (series 2, image 27). This is immediately adjacent to the left first and second maxillary molars, likely the source of infection. No extension of inflammatory changes into the deeper spaces of the face/neck at this time. Oral cavity within normal limits. Oropharynx and nasopharynx within normal limits. No retropharyngeal collection or swelling. Negative epiglottis. Hypopharynx, supraglottic larynx, and glottis within normal limits. Subglottic airway clear. Salivary glands: Salivary glands including the parotid and submandibular glands are within normal limits. Thyroid : Normal. Lymph nodes: Mild asymmetric prominence of subcentimeter  left upper cervical lymph nodes, presumably reactive. No other enlarged or pathologic lymph nodes within the neck. Vascular: Normal intravascular enhancement seen within the neck. Mild atheromatous change about the carotid bifurcations without stenosis. Limited intracranial: Unremarkable. Visualized orbits: Unremarkable. Mastoids and visualized paranasal sinuses: Mild-to-moderate mucosal thickening noted about the left maxillary sinus. Paranasal sinuses are otherwise clear. Visualized mastoids and middle ear cavities are well pneumatized and free of fluid. Skeleton: No discrete or worrisome osseous lesions. Moderate spondylosis present at C5-6.  Upper chest: No other acute finding. Other: None. IMPRESSION: 1. Persistent swelling with hazy inflammatory stranding within the lower left face adjacent to the left mandible, suggesting persistent infection/cellulitis. Overall, appearance is improved as compared to prior CT from 10/31/2023. 2. Interval development of a 7 mm odontogenic abscess along the buccal aspect of the left maxilla, immediately adjacent to the left first and second maxillary molars, likely the source of infection. 3. Mild-to-moderate left maxillary sinusitis. Electronically Signed   By: Morene Hoard M.D.   On: 11/03/2023 02:12   CT Soft Tissue Neck W Contrast Result Date: 10/31/2023 EXAM: CT NECK WITH CONTRAST 10/31/2023 11:20:37 AM TECHNIQUE: CT of the neck was performed with the administration of intravenous contrast. Multiplanar reformatted images are provided for review. Automated exposure control, iterative reconstruction, and/or weight based adjustment of the mA/kV was utilized to reduce the radiation dose to as low as reasonably achievable. CONTRAST: 75mL iohexol  (OMNIPAQUE ) 300 MG/ML solution COMPARISON: None available. CLINICAL HISTORY: Soft tissue infection suspected, neck, xray done; soft tissue infection concern, no xr done, L sided neck and face swelling, fever. Pt comes with sore throat, neck pain and jaw swelling. Pt states this all started Saturday morning. Pt states no relief with pain meds. Pt states no dental pain but gum pain. FINDINGS: AERODIGESTIVE TRACT: No discrete mass. No edema. There is a periapical lucency involving the left second mandibular molar. There is no evidence of abscess. SALIVARY GLANDS: The parotid and submandibular glands are unremarkable. THYROID : Unremarkable. LYMPH NODES: There are shotty cervical lymph nodes present bilaterally involving the level 1, 2 and 3 nodal chains. SOFT TISSUES: There is mild subcutaneous edema present overlying the mandible on the left. There are a couple of small  dystrophic calcifications within the left palatine tonsils. BRAIN, ORBITS, SINUSES AND MASTOIDS: There is circumferential mucosal disease present in the floor of the left maxillary sinus. LUNGS AND MEDIASTINUM: No acute abnormality. BONES: No focal bone abnormality. IMPRESSION: 1. Mild subcutaneous edema overlying the mandible on the left. No evidence of abscess. 2. Periapical lucency involving the left second mandibular molar. 3. Circumferential mucosal disease in the floor of the left maxillary sinus. Electronically signed by: evalene coho 10/31/2023 12:49 PM EDT RP Workstation: [id number]   DG Chest 2 View Result Date: 10/31/2023 EXAM: 2 VIEW(S) XRAY OF THE CHEST 10/31/2023 09:14:00 AM COMPARISON: 11/27/2010 CLINICAL HISTORY: Cough. Pt comes with sore throat, neck pain and jaw swelling. Pt states this all started Saturday morning. Pt states no relief with pain meds. Pt states no dental pain but gum pain. FINDINGS: LUNGS AND PLEURA: No focal pulmonary opacity. No pulmonary edema. No pleural effusion. No pneumothorax. HEART AND MEDIASTINUM: No acute abnormality of the cardiac and mediastinal silhouettes. BONES AND SOFT TISSUES: No acute osseous abnormality. IMPRESSION: 1. No acute process. Electronically signed by: Franky Stanford MD 10/31/2023 09:56 AM EDT RP Workstation: [id number]    Microbiology: Recent Results (from the past 240 hours)  Group A Strep by PCR ([facility] Only)     Status:  None   Collection Time: 10/31/23  8:58 AM   Specimen: Throat; Sterile Swab  Result Value Ref Range Status   Group A Strep by PCR NOT DETECTED NOT DETECTED Final    Comment: Performed at [facility], [address] [place], [place] [address]     Labs: Basic Metabolic Panel: Recent Labs  Lab 10/31/23 0858 11/02/23 1718 11/03/23 0724 11/04/23 0350  NA 130* 135 136 139  K 4.2 3.7 3.6 3.4*  CL 98 100 101 103  CO2 21* 23 25 28   GLUCOSE 324* 361* 293* 151*  BUN 15 14 13 13   CREATININE 0.70 0.80 0.61  0.64  CALCIUM 9.3 9.4 8.6* 9.3   Liver Function Tests: Recent Labs  Lab 11/02/23 1718 11/03/23 0724  AST 50* 42*  ALT 80* 69*  ALKPHOS 99 66  BILITOT 0.7 0.8  PROT 7.4 6.5  ALBUMIN 3.9 3.5   No results for input(s): LIPASE, AMYLASE in the last 168 hours. No results for input(s): AMMONIA in the last 168 hours. CBC: Recent Labs  Lab 10/31/23 0858 11/02/23 1718 11/03/23 0724 11/04/23 0350  WBC 8.6 7.4 5.6 9.6  NEUTROABS 6.3 5.1  --   --   HGB 14.9 14.8 13.2 13.6  HCT 42.6 43.1 38.5 39.5  MCV 96.6 98.4 98.7 97.1  PLT 120* 139* 115* 141*   Cardiac Enzymes: No results for input(s): CKTOTAL, CKMB, CKMBINDEX, TROPONINI in the last 168 hours. BNP: BNP (last 3 results) No results for input(s): BNP in the last 8760 hours.  ProBNP (last 3 results) No results for input(s): PROBNP in the last 8760 hours.  CBG: Recent Labs  Lab 11/03/23 2058 11/03/23 2323 11/04/23 0340 11/04/23 0755 11/04/23 1218  GLUCAP 358* 296* 160* 173* 184*       Signed:  Devaughn KATHEE Ban MD.  [facility] 11/04/2023, 1:33 PM

## 2023-11-04 NOTE — Plan of Care (Signed)

## 2023-11-04 NOTE — Progress Notes (Signed)
 IV removed. Discharge education completed. Patient being discharged to private vehicle. Independent and oriented.  Yvonne Olson [name]

## 2023-11-04 NOTE — Plan of Care (Signed)

## 2023-11-07 ENCOUNTER — Ambulatory Visit: Admitting: Family Medicine

## 2023-11-11 ENCOUNTER — Other Ambulatory Visit: Payer: Self-pay

## 2023-11-11 MED ORDER — HYDROCODONE-ACETAMINOPHEN 5-325 MG PO TABS
1.0000 | ORAL_TABLET | Freq: Four times a day (QID) | ORAL | 0 refills | Status: AC | PRN
  Filled 2023-11-11: qty 12, 3d supply, fill #0

## 2023-11-11 MED ORDER — CHLORHEXIDINE GLUCONATE 0.12 % MT SOLN
15.0000 mL | Freq: Two times a day (BID) | OROMUCOSAL | 0 refills | Status: AC
  Filled 2023-11-11: qty 473, 16d supply, fill #0

## 2023-11-11 MED ORDER — AMOXICILLIN-POT CLAVULANATE 875-125 MG PO TABS
1.0000 | ORAL_TABLET | Freq: Two times a day (BID) | ORAL | 0 refills | Status: AC
  Filled 2023-11-11: qty 20, 10d supply, fill #0

## 2023-11-15 ENCOUNTER — Other Ambulatory Visit: Payer: Self-pay

## 2023-11-15 ENCOUNTER — Ambulatory Visit (INDEPENDENT_AMBULATORY_CARE_PROVIDER_SITE_OTHER): Admitting: Family Medicine

## 2023-11-15 VITALS — BP 164/111 | HR 91 | Temp 98.7°F | Resp 18 | Ht 71.0 in | Wt 195.0 lb

## 2023-11-15 DIAGNOSIS — I10 Essential (primary) hypertension: Secondary | ICD-10-CM

## 2023-11-15 DIAGNOSIS — R030 Elevated blood-pressure reading, without diagnosis of hypertension: Secondary | ICD-10-CM

## 2023-11-15 DIAGNOSIS — F339 Major depressive disorder, recurrent, unspecified: Secondary | ICD-10-CM | POA: Diagnosis not present

## 2023-11-15 DIAGNOSIS — Z794 Long term (current) use of insulin: Secondary | ICD-10-CM

## 2023-11-15 DIAGNOSIS — L03211 Cellulitis of face: Secondary | ICD-10-CM

## 2023-11-15 DIAGNOSIS — E1165 Type 2 diabetes mellitus with hyperglycemia: Secondary | ICD-10-CM

## 2023-11-15 DIAGNOSIS — F325 Major depressive disorder, single episode, in full remission: Secondary | ICD-10-CM

## 2023-11-15 MED ORDER — FLUOXETINE HCL 10 MG PO CAPS
10.0000 mg | ORAL_CAPSULE | Freq: Every day | ORAL | 1 refills | Status: AC
  Filled 2023-11-15 (×2): qty 90, 90d supply, fill #0

## 2023-11-15 MED ORDER — METFORMIN HCL 500 MG PO TABS
500.0000 mg | ORAL_TABLET | Freq: Two times a day (BID) | ORAL | 3 refills | Status: AC
  Filled 2023-11-15: qty 180, 90d supply, fill #0

## 2023-11-15 MED ORDER — BLOOD GLUCOSE MONITOR SYSTEM W/DEVICE KIT
1.0000 | PACK | Freq: Three times a day (TID) | 0 refills | Status: AC
  Filled 2023-11-15: qty 1, 1d supply, fill #0

## 2023-11-15 MED ORDER — CLONIDINE HCL 0.1 MG PO TABS
0.1000 mg | ORAL_TABLET | Freq: Once | ORAL | Status: AC
  Administered 2023-11-15: 0.1 mg via ORAL
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
## 2023-11-21 DIAGNOSIS — F325 Major depressive disorder, single episode, in full remission: Secondary | ICD-10-CM | POA: Insufficient documentation

## 2023-11-21 NOTE — Assessment & Plan Note (Signed)
 Has resolved after a course of Augmentin .  Has 3 more days of antibiotics please take these.  Please use to periodontiic mouth rinse twice daily

## 2023-11-21 NOTE — Assessment & Plan Note (Signed)
 Has not taken her blood pressure medicine in about a week.  Reports that she is gena pick it up from the pharmacy today.  Gave her clonidine  0.1 mg in the office although it had little effect.

## 2023-11-21 NOTE — Assessment & Plan Note (Signed)
 Start fluoxetine  10 mg daily follow-up in a months we can see how you are doing.

## 2023-12-16 ENCOUNTER — Ambulatory Visit: Admitting: Family Medicine

## 2024-03-29 ENCOUNTER — Emergency Department

## 2024-03-29 ENCOUNTER — Other Ambulatory Visit: Payer: Self-pay

## 2024-03-29 ENCOUNTER — Emergency Department
Admission: EM | Admit: 2024-03-29 | Discharge: 2024-03-30 | Disposition: A | Discharge status: Home / Self Care | Attending: Emergency Medicine | Admitting: Emergency Medicine

## 2024-03-29 DIAGNOSIS — R1011 Right upper quadrant pain: Secondary | ICD-10-CM | POA: Insufficient documentation

## 2024-03-29 DIAGNOSIS — R1110 Vomiting, unspecified: Secondary | ICD-10-CM

## 2024-03-29 DIAGNOSIS — R748 Abnormal levels of other serum enzymes: Secondary | ICD-10-CM | POA: Insufficient documentation

## 2024-03-29 DIAGNOSIS — R1013 Epigastric pain: Secondary | ICD-10-CM | POA: Diagnosis present

## 2024-03-29 DIAGNOSIS — R112 Nausea with vomiting, unspecified: Secondary | ICD-10-CM | POA: Diagnosis not present

## 2024-03-29 DIAGNOSIS — R109 Unspecified abdominal pain: Secondary | ICD-10-CM

## 2024-03-29 LAB — COMPREHENSIVE METABOLIC PANEL WITH GFR
ALT: 42 U/L (ref 0–44)
AST: 28 U/L (ref 15–41)
Albumin: 4.7 g/dL (ref 3.5–5.0)
Alkaline Phosphatase: 84 U/L (ref 38–126)
Anion gap: 13 (ref 5–15)
BUN: 17 mg/dL (ref 6–20)
CO2: 22 mmol/L (ref 22–32)
Calcium: 9.7 mg/dL (ref 8.9–10.3)
Chloride: 100 mmol/L (ref 98–111)
Creatinine, Ser: 0.69 mg/dL (ref 0.44–1.00)
GFR, Estimated: >60 mL/min
Glucose, Bld: 297 mg/dL — ABNORMAL HIGH (ref 70–99)
Potassium: 3.7 mmol/L (ref 3.5–5.1)
Sodium: 134 mmol/L — ABNORMAL LOW (ref 135–145)
Total Bilirubin: 0.3 mg/dL (ref 0.0–1.2)
Total Protein: 7.4 g/dL (ref 6.5–8.1)

## 2024-03-29 LAB — URINALYSIS, ROUTINE W REFLEX MICROSCOPIC
Bilirubin Urine: NEGATIVE
Glucose, UA: >=500 mg/dL — AB
Hgb urine dipstick: NEGATIVE
Ketones, ur: NEGATIVE mg/dL
Leukocytes,Ua: NEGATIVE
Nitrite: NEGATIVE
Protein, ur: NEGATIVE mg/dL
RBC / HPF: 0 RBC/hpf (ref 0–5)
Specific Gravity, Urine: 1.015 (ref 1.005–1.030)
pH: 5 (ref 5.0–8.0)

## 2024-03-29 LAB — CBC
HCT: 43.5 % (ref 36.0–46.0)
Hemoglobin: 15.2 g/dL — ABNORMAL HIGH (ref 12.0–15.0)
MCH: 34.2 pg — ABNORMAL HIGH (ref 26.0–34.0)
MCHC: 34.9 g/dL (ref 30.0–36.0)
MCV: 98 fL (ref 80.0–100.0)
Platelets: 178 K/uL (ref 150–400)
RBC: 4.44 MIL/uL (ref 3.87–5.11)
RDW: 11.9 % (ref 11.5–15.5)
WBC: 7.5 K/uL (ref 4.0–10.5)
nRBC: 0 % (ref 0.0–0.2)

## 2024-03-29 LAB — LIPASE, BLOOD: Lipase: 63 U/L — ABNORMAL HIGH (ref 11–51)

## 2024-03-29 MED ORDER — ONDANSETRON HCL 4 MG/2ML IJ SOLN
4.0000 mg | Freq: Once | INTRAMUSCULAR | Status: AC
  Administered 2024-03-29: 4 mg via INTRAVENOUS
  Filled 2024-03-29: qty 2

## 2024-03-29 MED ORDER — SUCRALFATE 1 G PO TABS
1.0000 g | ORAL_TABLET | Freq: Four times a day (QID) | ORAL | 1 refills | Status: AC
  Filled 2024-03-29: qty 120, 30d supply, fill #0

## 2024-03-29 MED ORDER — FENTANYL CITRATE (PF) 50 MCG/ML IJ SOSY
50.0000 ug | PREFILLED_SYRINGE | Freq: Once | INTRAMUSCULAR | Status: AC
  Administered 2024-03-29: 50 ug via INTRAVENOUS
  Filled 2024-03-29: qty 1

## 2024-03-29 MED ORDER — DICYCLOMINE HCL 10 MG PO CAPS
10.0000 mg | ORAL_CAPSULE | Freq: Three times a day (TID) | ORAL | 0 refills | Status: AC | PRN
  Filled 2024-03-29: qty 20, 7d supply, fill #0

## 2024-03-29 MED ORDER — ALUM & MAG HYDROXIDE-SIMETH 200-200-20 MG/5ML PO SUSP
30.0000 mL | Freq: Once | ORAL | Status: AC
  Administered 2024-03-30: 30 mL via ORAL
  Filled 2024-03-29: qty 30

## 2024-03-29 MED ORDER — IOHEXOL 300 MG/ML  SOLN
100.0000 mL | Freq: Once | INTRAMUSCULAR | Status: AC | PRN
  Administered 2024-03-29: 100 mL via INTRAVENOUS

## 2024-03-29 MED ORDER — KETOROLAC TROMETHAMINE 15 MG/ML IJ SOLN: 15.0000 mg | Freq: Once | INTRAMUSCULAR | Status: DC

## 2024-03-29 MED ORDER — SODIUM CHLORIDE 0.9 % IV BOLUS
1000.0000 mL | Freq: Once | INTRAVENOUS | Status: AC
  Administered 2024-03-29: 1000 mL via INTRAVENOUS

## 2024-03-29 MED ORDER — LIDOCAINE VISCOUS HCL 2 % MT SOLN
15.0000 mL | Freq: Once | OROMUCOSAL | Status: AC
  Administered 2024-03-30: 15 mL via ORAL
  Filled 2024-03-29: qty 15

## 2024-03-29 MED ORDER — ONDANSETRON 4 MG PO TBDP
4.0000 mg | ORAL_TABLET | Freq: Three times a day (TID) | ORAL | 0 refills | Status: AC | PRN
  Filled 2024-03-29: qty 20, 7d supply, fill #0

## 2024-03-29 NOTE — ED Triage Notes (Signed)
 Pt comes with right sided pain for weeks. Pt states she is also experiencing right foot pain. Pt states she has sore on it and not sure if it is infected. Pt states she has ran fever for last 4 days. Pt has tried otc meds with little relief. Pt is diabetic.

## 2024-03-29 NOTE — ED Provider Notes (Signed)
 "  [facility] Provider Note    Event Date/Time   First MD Initiated Contact with Patient 03/29/24 2038     (approximate)   History   Abdominal Pain and Fever   HPI  Yvonne Olson is a 44 y.o. female who presents to the emergency department today because of concerns for abdominal pain.  She states that she gets these episodes occasionally although this time it does last much longer than normal.  She states that she has been having the discomfort for roughly 2 weeks.  Located in the epigastrium and right upper quadrant.  It is worse with eating.  She describes it as a sharp pain.  The patient has also had fevers the past few days.  States he had a Tmax of 102 today.  She has had nausea and vomiting.  No bloody emesis.  She has not had any diarrhea and has not noticed any bloody or black or tarry stool.     Physical Exam   Triage Vital Signs: ED Triage Vitals  Encounter Vitals Group     BP 03/29/24 1650 (!) 197/110     Girls Systolic BP Percentile --      Girls Diastolic BP Percentile --      Boys Systolic BP Percentile --      Boys Diastolic BP Percentile --      Pulse Rate 03/29/24 1650 98     Resp 03/29/24 1650 18     Temp 03/29/24 1651 98.5 F (36.9 C)     Temp Source 03/29/24 1651 Oral     SpO2 03/29/24 1650 100 %     Weight 03/29/24 1648 198 lb (89.8 kg)     Height 03/29/24 1648 5' 11 (1.803 m)     Head Circumference --      Peak Flow --      Pain Score 03/29/24 1648 8     Pain Loc --      Pain Education --      Exclude from Growth Chart --     Most recent vital signs: Vitals:   03/29/24 1651 03/29/24 2042  BP:  (!) 175/102  Pulse:  82  Resp:  17  Temp: 98.5 F (36.9 C) 98.5 F (36.9 C)  SpO2:  98%   General: Awake, alert, oriented. CV:  Good peripheral perfusion. Regular rate and rhythm. Resp:  Normal effort. Lungs clear. Abd:  No distention. Tender to palpation in the epigastric and right upper quadrant   ED Results /  Procedures / Treatments   Labs (all labs ordered are listed, but only abnormal results are displayed) Labs Reviewed  LIPASE, BLOOD - Abnormal; Notable for the following components:      Result Value   Lipase 63 (*)    All other components within normal limits  COMPREHENSIVE METABOLIC PANEL WITH GFR - Abnormal; Notable for the following components:   Sodium 134 (*)    Glucose, Bld 297 (*)    All other components within normal limits  CBC - Abnormal; Notable for the following components:   Hemoglobin 15.2 (*)    MCH 34.2 (*)    All other components within normal limits  URINALYSIS, ROUTINE W REFLEX MICROSCOPIC - Abnormal; Notable for the following components:   Color, Urine STRAW (*)    APPearance CLEAR (*)    Glucose, UA >=500 (*)    Bacteria, UA RARE (*)    All other components within normal limits  EKG  None   RADIOLOGY I independently interpreted and visualized the CT abd/pel. My interpretation: No free air. Radiology interpretation:  IMPRESSION:  1. No CT evidence for acute intra-abdominal or pelvic abnormality.  2. Hepatomegaly with hepatic steatosis.  3. Aortic atherosclerosis.    Aortic Atherosclerosis (ICD10-I70.0).     PROCEDURES:  Critical Care performed: No    MEDICATIONS ORDERED IN ED: Medications - No data to display   IMPRESSION / MDM / ASSESSMENT AND PLAN / ED COURSE  I reviewed the triage vital signs and the nursing notes.                              Differential diagnosis includes, but is not limited to, pancreatitis, gastritis, gastroenteritis, UTI  Patient's presentation is most consistent with acute presentation with potential threat to life or bodily function.   Patient presented to the emergency department today with concerns for abdominal pain, nausea and vomiting.  On exam patient is somewhat tender in epigastric region.  No rebound abdomen is soft.  Blood work without concerning leukocytosis or electrolyte abnormality.  Very  slight elevation of lipase.  Did obtain a CT scan which did not show any acute abnormalities.  Patient did feel some improvement after first round of medication.  Do wonder if patient could be suffering from ulcerative disease given that it is worse after eating.  Will give patient GI cocktail. Do think patient is safe for discharge.   FINAL CLINICAL IMPRESSION(S) / ED DIAGNOSES   Final diagnoses:  Abdominal pain, unspecified abdominal location  Vomiting, unspecified vomiting type, unspecified whether nausea present     Note:  This document was prepared using Dragon voice recognition software and may include unintentional dictation errors.    Floy Roberts, MD 03/29/24 2348  "

## 2024-03-30 ENCOUNTER — Other Ambulatory Visit: Payer: Self-pay

## 2024-04-10 ENCOUNTER — Other Ambulatory Visit: Payer: Self-pay
